# Patient Record
Sex: Female | Born: 2000 | Hispanic: Yes | Marital: Single | Smoking: Never smoker
Health system: Southern US, Community
[De-identification: ages and names within clinical notes are randomized; demographics above are authoritative.]

## PROBLEM LIST (undated history)

## (undated) DIAGNOSIS — F419 Anxiety disorder, unspecified: Secondary | ICD-10-CM

## (undated) DIAGNOSIS — N912 Amenorrhea, unspecified: Secondary | ICD-10-CM

## (undated) DIAGNOSIS — E2839 Other primary ovarian failure: Secondary | ICD-10-CM

## (undated) DIAGNOSIS — R51 Headache: Secondary | ICD-10-CM

## (undated) DIAGNOSIS — F32A Depression, unspecified: Secondary | ICD-10-CM

## (undated) DIAGNOSIS — K829 Disease of gallbladder, unspecified: Secondary | ICD-10-CM

## (undated) DIAGNOSIS — K3184 Gastroparesis: Secondary | ICD-10-CM

## (undated) DIAGNOSIS — E282 Polycystic ovarian syndrome: Secondary | ICD-10-CM

## (undated) DIAGNOSIS — K589 Irritable bowel syndrome without diarrhea: Secondary | ICD-10-CM

## (undated) DIAGNOSIS — E229 Hyperfunction of pituitary gland, unspecified: Secondary | ICD-10-CM

## (undated) DIAGNOSIS — F329 Major depressive disorder, single episode, unspecified: Secondary | ICD-10-CM

## (undated) DIAGNOSIS — F988 Other specified behavioral and emotional disorders with onset usually occurring in childhood and adolescence: Secondary | ICD-10-CM

## (undated) HISTORY — DX: Irritable bowel syndrome, unspecified: K58.9

## (undated) HISTORY — DX: Gastroparesis: K31.84

## (undated) HISTORY — DX: Hyperfunction of pituitary gland, unspecified: E22.9

## (undated) HISTORY — DX: Disease of gallbladder, unspecified: K82.9

## (undated) HISTORY — DX: Headache: R51

## (undated) HISTORY — DX: Amenorrhea, unspecified: N91.2

---
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
## 2021-01-01 ENCOUNTER — Other Ambulatory Visit: Payer: Self-pay | Admitting: Obstetrics and Gynecology

## 2021-01-03 ENCOUNTER — Other Ambulatory Visit: Payer: Self-pay

## 2021-01-03 ENCOUNTER — Ambulatory Visit
Admission: RE | Admit: 2021-01-03 | Discharge: 2021-01-03 | Disposition: A | Discharge status: Home / Self Care | Payer: Self-pay | Source: Ambulatory Visit | Attending: Neurology | Admitting: Neurology

## 2021-01-03 ENCOUNTER — Telehealth: Payer: Self-pay | Admitting: Neurology

## 2021-01-03 ENCOUNTER — Other Ambulatory Visit: Payer: Self-pay | Admitting: Obstetrics and Gynecology

## 2021-01-03 DIAGNOSIS — H539 Unspecified visual disturbance: Secondary | ICD-10-CM

## 2021-01-03 DIAGNOSIS — R51 Headache with orthostatic component, not elsewhere classified: Secondary | ICD-10-CM

## 2021-01-03 DIAGNOSIS — R519 Headache, unspecified: Secondary | ICD-10-CM

## 2021-01-03 MED ORDER — GADOBENATE DIMEGLUMINE 529 MG/ML IV SOLN
20.0000 mL | Freq: Once | INTRAVENOUS | Status: AC | PRN
  Administered 2021-01-03: 20 mL via INTRAVENOUS

## 2021-01-03 NOTE — Telephone Encounter (Signed)
Cathy with [facility] called and left me a voicemail on my phone wanting to know what kind of labs they were doing with the LP. Her phone number is [phone] & fax # [phone]

## 2021-01-03 NOTE — Telephone Encounter (Signed)
Spoke w/ Dr. Epimenio Foot. Would like the following labs w/ LP: cbc w/ diff, protein, glucose. I called back and LVM relaying this. Advised her to call back if she has any more questions.

## 2021-01-03 NOTE — Telephone Encounter (Signed)
Medication refill request: Yasmin  Last AEX:  01-13-20 BS Next AEX: not scheduled, note to pharmacy to have patient call to schedule Last MMG (if hormonal medication request): n/a Refill authorized: Today, please advise.   Medication pended for #28, 0RF. Please refill if appropriate.

## 2021-01-07 ENCOUNTER — Ambulatory Visit
Admission: RE | Admit: 2021-01-07 | Discharge: 2021-01-07 | Disposition: A | Discharge status: Home / Self Care | Payer: Managed Care, Other (non HMO) | Source: Ambulatory Visit | Attending: Neurology | Admitting: Neurology

## 2021-01-07 ENCOUNTER — Other Ambulatory Visit: Payer: Self-pay

## 2021-01-07 VITALS — BP 125/65 | HR 77

## 2021-01-07 DIAGNOSIS — R519 Headache, unspecified: Secondary | ICD-10-CM

## 2021-01-07 DIAGNOSIS — H539 Unspecified visual disturbance: Secondary | ICD-10-CM

## 2021-01-07 DIAGNOSIS — R51 Headache with orthostatic component, not elsewhere classified: Secondary | ICD-10-CM

## 2021-01-07 LAB — GLUCOSE, CSF: Glucose, CSF: 64 mg/dL (ref 40–80)

## 2021-01-07 LAB — PROTEIN, CSF: Total Protein, CSF: 22 mg/dL (ref 15–45)

## 2021-01-07 NOTE — Discharge Instructions (Signed)
Lumbar Puncture Discharge Instructions  1. Go home and rest quietly as needed. You may resume normal activities; however, do not exert yourself strongly or do any heavy lifting today and tomorrow.   2. DO NOT drive today.    3. You may resume your normal diet and medications unless otherwise indicated. Drink lots of extra fluids today and tomorrow.   4. The incidence of headache, nausea, or vomiting is about 5% (one in 20 patients).  If you develop a headache, lie flat for 24 hours and drink plenty of fluids until the headache goes away.  Caffeinated beverages may be helpful. If when you get up you still have a headache when standing, go back to bed and force fluids for another 24 hours.   5. If you develop severe nausea and vomiting or a headache that does not go away with the flat bedrest after 48 hours, please call [phone].   6. Call your physician for a follow-up appointment.  The results of your Lumbar Puncture will be sent directly to your physician and they will contact you.   7. If you have any questions or if complications develop after you arrive home, please call [phone].  Discharge instructions have been explained to the patient.  The patient, or the person responsible for the patient, fully understands these instructions.   Thank you for visiting our office today.  

## 2021-01-10 ENCOUNTER — Telehealth: Payer: Self-pay | Admitting: Neurology

## 2021-01-10 ENCOUNTER — Other Ambulatory Visit: Payer: Self-pay

## 2021-01-10 ENCOUNTER — Other Ambulatory Visit: Payer: Self-pay | Admitting: Neurology

## 2021-01-10 ENCOUNTER — Ambulatory Visit
Admission: RE | Admit: 2021-01-10 | Discharge: 2021-01-10 | Disposition: A | Discharge status: Home / Self Care | Payer: Managed Care, Other (non HMO) | Source: Ambulatory Visit | Attending: Neurology | Admitting: Neurology

## 2021-01-10 DIAGNOSIS — G971 Other reaction to spinal and lumbar puncture: Secondary | ICD-10-CM

## 2021-01-10 MED ORDER — IOPAMIDOL (ISOVUE-M 200) INJECTION 41%
1.0000 mL | Freq: Once | INTRAMUSCULAR | Status: AC
  Administered 2021-01-10: 1 mL via EPIDURAL

## 2021-01-10 NOTE — Telephone Encounter (Signed)
Looks like pt is scheduled for 12 pm today.

## 2021-01-10 NOTE — Telephone Encounter (Signed)
Spoke with Dr Lucia Gaskins. Will have patient get a blood patch. Order placed and I called GI and LVM for [name] in the spinal services department.

## 2021-01-10 NOTE — Telephone Encounter (Addendum)
Pt's father called stating that GI is waiting on the order for the pt's blood patch for the pt's headache due to the lumbar puncture. Pt's father would like to be called once the order has been put in.

## 2021-01-10 NOTE — Telephone Encounter (Signed)
Answering service sent a text that she was having a severe headache (father had called).   She had an LP 01/07/2021.  I called back twice at 10:20 and 10:28 and message went straight to voicemail both times.  I LM that I would let you know that they had called

## 2021-01-10 NOTE — Progress Notes (Signed)
Blood drawn from pts RAC for blood patch by J. Nehemiah Massed, Charity fundraiser. 1 successful attempt. Pt tolerated well. Gauze and tape applied after.

## 2021-01-10 NOTE — Telephone Encounter (Signed)
Spoke with patient's father [name]. Pt had LP on 5/6, last Friday.  He stated she was on bedrest for about 22 hours before she took a shower.  Otherwise she would only sit up to eat or use the restroom.  He believes Friday night she did not have a headache at all until and on Saturday felt decent.  She developed a head cold and had some sneezing and coughing.  She stated her back was a little sore. He visualized her back and stated there was no swelling or bruising.  then the headache started Saturday night.  Initially was better while lying down but has progressed to a constant, severe headache. She is still resting and her father has tried to push fluids and caffeine.  She had a Coke last night and this morning he is going to have her drink a Mtn Dew.  He states the patient does not have any other symptoms such as nausea and vomiting, just headache.

## 2021-01-10 NOTE — Telephone Encounter (Signed)
[facility] at [facility] called wanting to know when the Blood Patch will be fax over to [phone]. Jeannie's phone number [phone] in case you are needing to speak to her. Please advise.

## 2021-01-10 NOTE — Discharge Instructions (Signed)

## 2021-01-11 ENCOUNTER — Other Ambulatory Visit: Payer: Self-pay | Admitting: *Deleted

## 2021-01-11 MED ORDER — BUTALBITAL-APAP-CAFFEINE 50-325-40 MG PO TABS: 1.0000 | ORAL_TABLET | Freq: Four times a day (QID) | ORAL | 0 refills | Status: DC | PRN
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
## 2021-01-11 NOTE — Telephone Encounter (Signed)
Spoke with Dr. Lucia Gaskins who advised pt can try Fioricet 1-2 tablets q6h prn.  We can also plan to see the patient tomorrow morning at 7:30 AM if she is not feeling better.  Will likely need a letter for school.   Spoke with the patient's father, [name], who is agreeable to the above plan.  He will be in touch with the patient's school.  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  He understands the instructions for the Fioricet.  I scheduled patient for tomorrow morning office visit.  He will call back at the end of the day to let us know how she is feeling.

## 2021-01-11 NOTE — Telephone Encounter (Signed)
Ok, she had a blood patch yesterday afternoon.

## 2021-01-11 NOTE — Telephone Encounter (Addendum)
Pt's father Loraine Leriche called [place]. He stated her head is still throbbing. It may have decreased in pain a little bit but she still has the headache whether she is sitting up or lying down. She is tired as well and dealing with a sinus cold. Father is concerned because the patient has finals this week starting tomorrow and she goes to school 2 hours away. She is currently at home. She has tried Tylenol. He said all of her medications are at school and they had enough to get her through Sunday but did not realize she would still be at home. She has not been able to take the Propranolol or the Zolmitriptan.

## 2021-01-11 NOTE — Telephone Encounter (Signed)
Would you see how she is doing today?

## 2021-01-12 ENCOUNTER — Other Ambulatory Visit: Payer: Self-pay

## 2021-01-12 ENCOUNTER — Other Ambulatory Visit: Payer: Self-pay | Admitting: Neurology

## 2021-01-12 ENCOUNTER — Ambulatory Visit: Payer: Managed Care, Other (non HMO) | Admitting: Neurology

## 2021-01-12 ENCOUNTER — Encounter: Payer: Self-pay | Admitting: Neurology

## 2021-01-12 VITALS — BP 116/79 | HR 99 | Ht 63.0 in | Wt 227.0 lb

## 2021-01-12 DIAGNOSIS — R519 Headache, unspecified: Secondary | ICD-10-CM

## 2021-01-12 DIAGNOSIS — G43009 Migraine without aura, not intractable, without status migrainosus: Secondary | ICD-10-CM

## 2021-01-12 MED ORDER — PROPRANOLOL HCL ER 80 MG PO CP24: 80.0000 mg | ORAL_CAPSULE | Freq: Every day | ORAL | 3 refills | Status: DC

## 2021-01-13 ENCOUNTER — Encounter: Payer: Self-pay | Admitting: Neurology

## 2021-01-13 NOTE — Addendum Note (Signed)
Addended by: Naomie Dean B on: 01/13/2021 09:22 AM   Modules accepted: Level of Service

## 2021-01-13 NOTE — Addendum Note (Signed)
Addended by: Naomie Dean B on: 01/13/2021 08:51 AM   Modules accepted: Level of Service

## 2021-02-07 ENCOUNTER — Other Ambulatory Visit: Payer: Self-pay | Admitting: Obstetrics and Gynecology

## 2021-02-08 NOTE — Progress Notes (Deleted)
20 y.o. G0P0000 Single Hispanic female here for annual exam.    PCP:  Dorinda Hill, MD   No LMP recorded. (Menstrual status: Oral contraceptives).           Sexually active: No.  The current method of family planning is OCP (estrogen/progesterone) Yasmin. Exercising: No.  Smoker: No  Health Maintenance: Pap: n/a History of abnormal Pap: n/a TDaP:  *** Gardasil: No HIV: No Hep C: No Screening Labs: PCP   reports that she has never smoked. She has never used smokeless tobacco. She reports that she does not drink alcohol and does not use drugs.  Past Medical History:  Diagnosis Date  . Acquired primary ovarian hypogonadism   . ADD (attention deficit disorder)   . Amenorrhea    when cycles first started age 7  . Anxiety   . Depression   . Elevated prolactin level 09/16/2018   Taking Pamelor.  . Headache(784.0)   . IBS (irritable bowel syndrome)   . PCOS (polycystic ovarian syndrome)   . PCOS (polycystic ovarian syndrome)     Past Surgical History:  Procedure Laterality Date  . CHOLECYSTECTOMY    . CHOLECYSTECTOMY  2018    Current Outpatient Medications  Medication Sig Dispense Refill  . Acetaminophen (TYLENOL PO) Take 1 tablet by mouth daily as needed (pain). Fast acting    . butalbital-acetaminophen-caffeine (FIORICET) 50-325-40 MG tablet Take 1-2 tablets by mouth every 6 (six) hours as needed for headache. 15 tablet 0  . dicyclomine (BENTYL) 20 MG tablet Take 20 mg by mouth as needed (pain).     . drospirenone-ethinyl estradiol (YASMIN) 3-0.03 MG tablet TAKE 1 TABLET BY MOUTH DAILY 28 tablet 0  . gabapentin (NEURONTIN) 300 MG capsule Take 300 mg by mouth 2 (two) times daily.    . hyoscyamine (LEVSIN SL) 0.125 MG SL tablet Place under the tongue every 4 (four) hours as needed.    Marland Kitchen ibuprofen (ADVIL) 200 MG tablet Take 200 mg by mouth every 6 (six) hours as needed.    . melatonin 5 MG TABS Take 5 mg by mouth. Natrol melatonin    . metroNIDAZOLE (METROGEL) 0.75 % gel  Apply topically daily as needed.    . nortriptyline (PAMELOR) 25 MG capsule nortriptyline 25 mg capsule  TAKE 1 CAPSULE BY MOUTH AT BEDTIME FOR HEADACHE (Patient not taking: Reported on 01/12/2021)    . omeprazole (PRILOSEC) 20 MG capsule omeprazole 20 mg capsule,delayed release  Take 1 capsule every day by oral route in the morning. (Patient not taking: Reported on 01/12/2021)    . ondansetron (ZOFRAN-ODT) 4 MG disintegrating tablet Take 1-2 tablets (4-8 mg total) by mouth every 8 (eight) hours as needed. 30 tablet 3  . pantoprazole (PROTONIX) 40 MG tablet pantoprazole 40 mg tablet,delayed release  TAKE 1 TABLET BY MOUTH EVERY MORNING BEFORE BREAKFAST FOR 10 DAYS    . propranolol ER (INDERAL LA) 80 MG 24 hr capsule Take 1 capsule (80 mg total) by mouth at bedtime. 90 capsule 3  . sertraline (ZOLOFT) 100 MG tablet Take 100 mg by mouth daily.    . sodium fluoride (FLUORISHIELD) 1.1 % GEL dental gel PreviDent 5000 Dry Mouth 1.1 % dental paste    . UNABLE TO FIND Med Name: IMD Complete Probiotic Platinum    . Vitamin D, Ergocalciferol, (DRISDOL) 1.25 MG (50000 UNIT) CAPS capsule Take 1 capsule by mouth every 14 (fourteen) days.    Marland Kitchen ZOLMitriptan (ZOMIG) 2.5 MG tablet Take 1 tablet by mouth at onset  of migraine. May repeat in 2 hours if headache persists or recurs. Max of 2 tablets (5 mg) in 24 hours. 10 tablet 2   No current facility-administered medications for this visit.    Family History  Adopted: Yes    Review of Systems  Exam:   There were no vitals taken for this visit.    General appearance: alert, cooperative and appears stated age Head: normocephalic, without obvious abnormality, atraumatic Neck: no adenopathy, supple, symmetrical, trachea midline and thyroid normal to inspection and palpation Lungs: clear to auscultation bilaterally Breasts: normal appearance, no masses or tenderness, No nipple retraction or dimpling, No nipple discharge or bleeding, No axillary adenopathy Heart:  regular rate and rhythm Abdomen: soft, non-tender; no masses, no organomegaly Extremities: extremities normal, atraumatic, no cyanosis or edema Skin: skin color, texture, turgor normal. No rashes or lesions Lymph nodes: cervical, supraclavicular, and axillary nodes normal. Neurologic: grossly normal  Pelvic: External genitalia:  no lesions              No abnormal inguinal nodes palpated.              Urethra:  normal appearing urethra with no masses, tenderness or lesions              Bartholins and Skenes: normal                 Vagina: normal appearing vagina with normal color and discharge, no lesions              Cervix: no lesions              Pap taken: {yes no:314532} Bimanual Exam:  Uterus:  normal size, contour, position, consistency, mobility, non-tender              Adnexa: no mass, fullness, tenderness              Rectal exam: {yes no:314532}.  Confirms.              Anus:  normal sphincter tone, no lesions  Chaperone was present for exam.  Assessment:   Well woman visit with normal exam.   Plan: Mammogram screening discussed. Self breast awareness reviewed. Pap and HR HPV as above. Guidelines for Calcium, Vitamin D, regular exercise program including cardiovascular and weight bearing exercise.   Follow up annually and prn.   Additional counseling given.  {yes [id number]. _______ minutes face to face time of which over 50% was spent in counseling.    After visit summary provided.

## 2021-02-11 NOTE — Progress Notes (Signed)
20 y.o. G0P0000 Single Hispanic female here for annual exam.    Menses are monthly.  Medium flow.  No extensive cramping.   Abdominal pain is improved.   Taking birth control pills and has monthly cycles. Takes 2 pills late or skips per month.  Wants to continue taking her pills.   Not sexually active and does not need pregnancy prevention.   Going to school at [employer].   PCP:    Chales Salmon, MD    Patient's last menstrual period was 01/06/2020 (exact date).     Period Cycle (Days): 30 Period Duration (Days): 6-7 Period Pattern: Regular Menstrual Flow: Moderate Menstrual Control: Maxi pad, Panty liner Dysmenorrhea: None     Sexually active: No.  The current method of family planning is OCP (estrogen/progesterone).    Exercising: No.  The patient does not participate in regular exercise at present. Smoker:  no  Health Maintenance: Pap:  n/a History of abnormal Pap:  n/a Colonoscopy:  n/a TDaP:  unsure Gardasil:   no HIV: no Hep C: no Screening Labs:  PCP   reports that she has never smoked. She has never used smokeless tobacco. She reports that she does not drink alcohol and does not use drugs.  Past Medical History:  Diagnosis Date   Acquired primary ovarian hypogonadism    ADD (attention deficit disorder)    Amenorrhea    when cycles first started age 47   Anxiety    Depression    Elevated prolactin level 09/16/2018   Taking Pamelor.   Headache(784.0)    IBS (irritable bowel syndrome)    PCOS (polycystic ovarian syndrome)    PCOS (polycystic ovarian syndrome)     Past Surgical History:  Procedure Laterality Date   CHOLECYSTECTOMY     CHOLECYSTECTOMY  2018    Current Outpatient Medications  Medication Sig Dispense Refill   Acetaminophen (TYLENOL PO) Take 1 tablet by mouth daily as needed (pain). Fast acting     butalbital-acetaminophen-caffeine (FIORICET) 50-325-40 MG tablet Take 1-2 tablets by mouth every 6 (six) hours as needed for headache. 15  tablet 0   dicyclomine (BENTYL) 20 MG tablet Take 20 mg by mouth as needed (pain).      gabapentin (NEURONTIN) 300 MG capsule Take 300 mg by mouth 2 (two) times daily.     hyoscyamine (LEVSIN SL) 0.125 MG SL tablet Place under the tongue every 4 (four) hours as needed.     ibuprofen (ADVIL) 200 MG tablet Take 200 mg by mouth every 6 (six) hours as needed.     melatonin 5 MG TABS Take 5 mg by mouth. Natrol melatonin     ondansetron (ZOFRAN-ODT) 4 MG disintegrating tablet Take 1-2 tablets (4-8 mg total) by mouth every 8 (eight) hours as needed. 30 tablet 3   pantoprazole (PROTONIX) 40 MG tablet pantoprazole 40 mg tablet,delayed release  TAKE 1 TABLET BY MOUTH EVERY MORNING BEFORE BREAKFAST FOR 10 DAYS     propranolol ER (INDERAL LA) 80 MG 24 hr capsule Take 1 capsule (80 mg total) by mouth at bedtime. 90 capsule 3   sertraline (ZOLOFT) 100 MG tablet Take 100 mg by mouth daily.     sodium fluoride (FLUORISHIELD) 1.1 % GEL dental gel PreviDent 5000 Dry Mouth 1.1 % dental paste     UNABLE TO FIND Med Name: IMD Complete Probiotic Platinum     Vitamin D, Ergocalciferol, (DRISDOL) 1.25 MG (50000 UNIT) CAPS capsule Take 1 capsule by mouth every 14 (fourteen) days.  ZOLMitriptan (ZOMIG) 2.5 MG tablet Take 1 tablet by mouth at onset of migraine. May repeat in 2 hours if headache persists or recurs. Max of 2 tablets (5 mg) in 24 hours. 10 tablet 2   drospirenone-ethinyl estradiol (YASMIN) 3-0.03 MG tablet Take 1 tablet by mouth daily. 84 tablet 3   nortriptyline (PAMELOR) 25 MG capsule nortriptyline 25 mg capsule  TAKE 1 CAPSULE BY MOUTH AT BEDTIME FOR HEADACHE (Patient not taking: Reported on 01/12/2021)     omeprazole (PRILOSEC) 20 MG capsule omeprazole 20 mg capsule,delayed release  Take 1 capsule every day by oral route in the morning. (Patient not taking: Reported on 01/12/2021)     No current facility-administered medications for this visit.    Family History  Adopted: Yes    Review of Systems   Constitutional: Negative.   HENT: Negative.    Eyes: Negative.   Respiratory: Negative.    Cardiovascular: Negative.   Gastrointestinal: Negative.   Endocrine: Negative.   Genitourinary: Negative.   Musculoskeletal: Negative.   Skin: Negative.   Allergic/Immunologic: Negative.   Neurological: Negative.   Hematological: Negative.   Psychiatric/Behavioral: Negative.     Exam:   BP 130/88   Pulse 94   Ht 5\' 2"  (1.575 m)   Wt 230 lb (104.3 kg)   LMP 02/07/2021 (Exact Date)   SpO2 98%   BMI 42.07 kg/m     General appearance: alert, cooperative and appears stated age Head: normocephalic, without obvious abnormality, atraumatic Neck: no adenopathy, supple, symmetrical, trachea midline and thyroid normal to inspection and palpation Lungs: clear to auscultation bilaterally Heart: regular rate and rhythm Abdomen: soft, non-tender; no masses, no organomegaly Extremities: extremities normal, atraumatic, no cyanosis or edema Skin: skin color, texture, turgor normal. No rashes or lesions Lymph nodes: cervical, supraclavicular, and axillary nodes normal. Neurologic: grossly normal  Pelvic: deferred.   Chaperone was present for exam:  [place].  Assessment:   Well woman visit with normal exam. Dysmenorrhea.   Controlled on COCs. Birth control pill surveillance. Hx PCOS. Chronic abdominal pain.   Improved.  Hx elevated prolactin.  On Pamelor.   Plan: Mammogram screening age 83 Pap age 58.  Guidelines for Calcium, Vitamin D, regular exercise program including cardiovascular and weight bearing exercise. Start Gardasil vaccine series.  Refill of Yasmin.  Check prolactin level.  Follow up annually and prn.

## 2021-02-15 ENCOUNTER — Ambulatory Visit: Payer: Managed Care, Other (non HMO) | Admitting: Obstetrics and Gynecology

## 2021-02-15 ENCOUNTER — Encounter: Payer: Self-pay | Admitting: Obstetrics and Gynecology

## 2021-02-15 ENCOUNTER — Ambulatory Visit (INDEPENDENT_AMBULATORY_CARE_PROVIDER_SITE_OTHER): Payer: Managed Care, Other (non HMO) | Admitting: Obstetrics and Gynecology

## 2021-02-15 ENCOUNTER — Other Ambulatory Visit: Payer: Self-pay

## 2021-02-15 VITALS — BP 130/88 | HR 94 | Ht 62.0 in | Wt 230.0 lb

## 2021-02-15 DIAGNOSIS — Z3041 Encounter for surveillance of contraceptive pills: Secondary | ICD-10-CM

## 2021-02-15 DIAGNOSIS — Z8639 Personal history of other endocrine, nutritional and metabolic disease: Secondary | ICD-10-CM | POA: Diagnosis not present

## 2021-02-15 DIAGNOSIS — Z23 Encounter for immunization: Secondary | ICD-10-CM

## 2021-02-15 DIAGNOSIS — Z0000 Encounter for general adult medical examination without abnormal findings: Secondary | ICD-10-CM | POA: Diagnosis not present

## 2021-02-15 MED ORDER — DROSPIRENONE-ETHINYL ESTRADIOL 3-0.03 MG PO TABS: 1.0000 | ORAL_TABLET | Freq: Every day | ORAL | 3 refills | Status: DC

## 2021-02-15 NOTE — Patient Instructions (Signed)

## 2021-02-16 LAB — PROLACTIN: Prolactin: 12.3 ng/mL

## 2021-02-24 ENCOUNTER — Telehealth: Payer: Self-pay

## 2021-02-24 NOTE — Telephone Encounter (Signed)
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  I advised him it is actually due in 2 months and patient will be home from school at that time so issue resolved.

## 2021-04-13 ENCOUNTER — Ambulatory Visit: Payer: Managed Care, Other (non HMO) | Admitting: Neurology

## 2021-04-26 ENCOUNTER — Telehealth: Payer: Self-pay

## 2021-04-26 NOTE — Telephone Encounter (Signed)
Dad called regarding when HPV vaccine due and if she should come before returning to school. She received first vaccine 02/15/21/ I advised him next vaccine is 1-2  mos after first one.  He will call to schedule appointment this week after speaking with daughter.

## 2021-04-27 ENCOUNTER — Ambulatory Visit: Payer: Managed Care, Other (non HMO)

## 2021-11-26 ENCOUNTER — Other Ambulatory Visit: Payer: Self-pay | Admitting: Neurology

## 2022-01-30 ENCOUNTER — Emergency Department (HOSPITAL_BASED_OUTPATIENT_CLINIC_OR_DEPARTMENT_OTHER): Payer: Managed Care, Other (non HMO)

## 2022-01-30 ENCOUNTER — Other Ambulatory Visit: Payer: Self-pay

## 2022-01-30 ENCOUNTER — Emergency Department (HOSPITAL_BASED_OUTPATIENT_CLINIC_OR_DEPARTMENT_OTHER)
Admission: EM | Admit: 2022-01-30 | Discharge: 2022-01-31 | Disposition: A | Discharge status: Home / Self Care | Payer: Managed Care, Other (non HMO) | Attending: Emergency Medicine | Admitting: Emergency Medicine

## 2022-01-30 ENCOUNTER — Encounter (HOSPITAL_BASED_OUTPATIENT_CLINIC_OR_DEPARTMENT_OTHER): Payer: Self-pay | Admitting: Emergency Medicine

## 2022-01-30 DIAGNOSIS — W540XXA Bitten by dog, initial encounter: Secondary | ICD-10-CM | POA: Diagnosis not present

## 2022-01-30 DIAGNOSIS — S51012A Laceration without foreign body of left elbow, initial encounter: Secondary | ICD-10-CM | POA: Diagnosis not present

## 2022-01-30 DIAGNOSIS — Z91040 Latex allergy status: Secondary | ICD-10-CM | POA: Insufficient documentation

## 2022-01-30 DIAGNOSIS — S81012A Laceration without foreign body, left knee, initial encounter: Secondary | ICD-10-CM | POA: Diagnosis not present

## 2022-01-30 DIAGNOSIS — S51812A Laceration without foreign body of left forearm, initial encounter: Secondary | ICD-10-CM | POA: Insufficient documentation

## 2022-01-30 DIAGNOSIS — S61532A Puncture wound without foreign body of left wrist, initial encounter: Secondary | ICD-10-CM | POA: Diagnosis not present

## 2022-01-30 DIAGNOSIS — Z23 Encounter for immunization: Secondary | ICD-10-CM | POA: Diagnosis not present

## 2022-01-30 DIAGNOSIS — S59902A Unspecified injury of left elbow, initial encounter: Secondary | ICD-10-CM | POA: Diagnosis present

## 2022-01-30 DIAGNOSIS — S61236A Puncture wound without foreign body of right little finger without damage to nail, initial encounter: Secondary | ICD-10-CM | POA: Insufficient documentation

## 2022-01-30 DIAGNOSIS — S61032A Puncture wound without foreign body of left thumb without damage to nail, initial encounter: Secondary | ICD-10-CM | POA: Diagnosis not present

## 2022-01-30 MED ORDER — HYDROCODONE-ACETAMINOPHEN 5-325 MG PO TABS
1.0000 | ORAL_TABLET | Freq: Once | ORAL | Status: DC | PRN
  Filled 2022-01-30: qty 1

## 2022-01-30 MED ORDER — LIDOCAINE-EPINEPHRINE-TETRACAINE (LET) TOPICAL GEL: 9.0000 mL | Freq: Once | TOPICAL | Status: DC

## 2022-01-30 MED ORDER — LIDOCAINE-EPINEPHRINE-TETRACAINE (LET) TOPICAL GEL
3.0000 mL | Freq: Once | TOPICAL | Status: AC
  Administered 2022-01-30: 3 mL via TOPICAL
  Filled 2022-01-30: qty 3

## 2022-01-30 MED ORDER — TETANUS-DIPHTH-ACELL PERTUSSIS 5-2.5-18.5 LF-MCG/0.5 IM SUSY
0.5000 mL | PREFILLED_SYRINGE | Freq: Once | INTRAMUSCULAR | Status: AC
  Administered 2022-01-30: 0.5 mL via INTRAMUSCULAR
  Filled 2022-01-30: qty 0.5

## 2022-01-30 MED ORDER — SODIUM CHLORIDE 0.9 % IV SOLN
3.0000 g | Freq: Once | INTRAVENOUS | Status: AC
  Administered 2022-01-31: 3 g via INTRAVENOUS

## 2022-01-30 NOTE — Progress Notes (Signed)
Patient with dog bite to the left distal thigh, with clinical concern for possible joint violation.  We have done an x-ray, as well as a CT scan, neither of which have confirmed any air within the joint.  I have discussed with the ER providers, who have indicated that there may be some adipose, or serous, or possible joint fluid extravasation from the wounds.  I have also critically evaluated the CT scan, and on multiple planes I can see hemorrhage within the subcutaneous adipose envelope, which is fairly substantial, BMI of 40, and yet I do not see evidence for joint violation, particularly at the location of the bite.  I also do not see intra-articular effusion or blood.  Plan for antibiotics while in the emergency room, oral antibiotics upon discharge, and evaluation tomorrow in our office.  It is difficult if not impossible to determine, although the radiographic findings so far do not appear that there has been substantial joint contamination.  We will monitor clinically and consider surgical washout if infection concern arises.  Teryl Lucy, MD

## 2022-01-30 NOTE — Progress Notes (Incomplete)
Patient with dog bite to the left distal thigh, with clinical concern for possible joint violation.  We have done an x-ray, as well as a CT scan, neither of which have confirmed any air within the joint.  I have discussed with the ER providers, who have indicated that there may be some adipose, or serous, or possible joint fluid extravasation from the wounds.  Plan for antibiotics while in the emergency room, oral antibiotics upon discharge, and evaluation tomorrow in our office.  It is difficult if not impossible to determine, although the radiographic findings so far do not appear that there has been substantial joint contamination.  We will monitor clinically and consider surgical washout if infection concern arises.  Teryl Lucy, MD

## 2022-01-30 NOTE — ED Triage Notes (Signed)
Patient was at her dad's house and was trying to separate two dogs fighting at his house. Patient has bite to left lower arm. Bleeding controlled. Multiple scratches to bilateral arms and legs.

## 2022-01-30 NOTE — ED Notes (Signed)
Pt trying to separate 2 dogs that were fighting and receives bites and scratches, areas of concern 2 to left knee that continue to ooze blood and left elbow.  Minor scratches noted to thighs and fingers

## 2022-01-30 NOTE — ED Provider Notes (Signed)
[facility] EMERGENCY DEPARTMENT Provider Note   CSN: [id number] Arrival date & time: 01/30/22  1839     History {Add pertinent medical, surgical, social history, OB history to HPI:1} Chief Complaint  Patient presents with   Animal Bite    [name] is a 21 y.o. female unknown last tetanus shot who presents today for evaluation after she was bit by her own dogs.  She reports that the dogs were fighting and she attempted to separate them.  She has bites to her right small finger, multiple bites on her left hand, forearm, and elbow.  He has not taken anything for pain so far.  She reports scratches on her abdomen however her primary area of pain is her left arm.  The dogs are up-to-date on vaccines according to patient and family who is at bedside.    HPI     Home Medications Prior to Admission medications   Medication Sig Start Date End Date Taking? Authorizing Provider  Acetaminophen (TYLENOL PO) Take 1 tablet by mouth daily as needed (pain). Fast acting    [provider]  butalbital-acetaminophen-caffeine (FIORICET) 50-325-40 MG tablet Take 1-2 tablets by mouth every 6 (six) hours as needed for headache. 01/11/21   Anson Fret, MD  dicyclomine (BENTYL) 20 MG tablet Take 20 mg by mouth as needed (pain).     [provider]  drospirenone-ethinyl estradiol (YASMIN) 3-0.03 MG tablet Take 1 tablet by mouth daily. 02/15/21   Patton Salles, MD  gabapentin (NEURONTIN) 300 MG capsule Take 300 mg by mouth 2 (two) times daily.    [provider]  hyoscyamine (LEVSIN SL) 0.125 MG SL tablet Place under the tongue every 4 (four) hours as needed. 10/21/20   [provider]  ibuprofen (ADVIL) 200 MG tablet Take 200 mg by mouth every 6 (six) hours as needed.    [provider]  melatonin 5 MG TABS Take 5 mg by mouth. Natrol melatonin    [provider]  nortriptyline (PAMELOR) 25 MG capsule nortriptyline 25 mg  capsule  TAKE 1 CAPSULE BY MOUTH AT BEDTIME FOR HEADACHE Patient not taking: Reported on 01/12/2021    [provider]  omeprazole (PRILOSEC) 20 MG capsule omeprazole 20 mg capsule,delayed release  Take 1 capsule every day by oral route in the morning. Patient not taking: Reported on 01/12/2021    [provider]  ondansetron (ZOFRAN-ODT) 4 MG disintegrating tablet Take 1-2 tablets (4-8 mg total) by mouth every 8 (eight) hours as needed. 12/14/20   Anson Fret, MD  pantoprazole (PROTONIX) 40 MG tablet pantoprazole 40 mg tablet,delayed release  TAKE 1 TABLET BY MOUTH EVERY MORNING BEFORE BREAKFAST FOR 10 DAYS 10/24/20   [provider]  propranolol ER (INDERAL LA) 80 MG 24 hr capsule Take 1 capsule (80 mg total) by mouth at bedtime. 01/12/21   Anson Fret, MD  sertraline (ZOLOFT) 100 MG tablet Take 100 mg by mouth daily. 11/18/19   [provider]  sodium fluoride (FLUORISHIELD) 1.1 % GEL dental gel PreviDent 5000 Dry Mouth 1.1 % dental paste    [provider]  UNABLE TO FIND Med Name: IMD Complete Probiotic Platinum    [provider]  Vitamin D, Ergocalciferol, (DRISDOL) 1.25 MG (50000 UNIT) CAPS capsule Take 1 capsule by mouth every 14 (fourteen) days. 11/13/20   [provider]  ZOLMitriptan (ZOMIG) 2.5 MG tablet Take 1 tablet by mouth at onset of migraine. May repeat  in 2 hours if headache persists or recurs. Max of 2 tablets (5 mg) in 24 hours. 12/23/20   Anson Fret, MD      Allergies    Latex    Review of Systems   Review of Systems See above Physical Exam Updated Vital Signs BP 122/65 (BP Location: Right Arm)   Pulse (!) 111   Temp 97.6 F (36.4 C)   Resp 18   Ht 5\' 3"  (1.6 m)   Wt 101.2 kg   LMP 01/23/2022 (Approximate)   SpO2 98%   BMI 39.50 kg/m  Physical Exam Vitals and nursing note reviewed.  Constitutional:      General: She is not in acute distress.    Appearance: She is not diaphoretic.   HENT:     Head: Normocephalic and atraumatic.  Eyes:     General: No scleral icterus.       Right eye: No discharge.        Left eye: No discharge.     Conjunctiva/sclera: Conjunctivae normal.  Cardiovascular:     Rate and Rhythm: Normal rate and regular rhythm.  Pulmonary:     Effort: Pulmonary effort is normal. No respiratory distress.     Breath sounds: No stridor.  Abdominal:     General: There is no distension.  Musculoskeletal:        General: No deformity.     Cervical back: Normal range of motion.  Skin:    General: Skin is warm and dry.  Neurological:     Mental Status: She is alert.     Motor: No abnormal muscle tone.  Psychiatric:        Behavior: Behavior normal.   *** ED Results / Procedures / Treatments   Labs (all labs ordered are listed, but only abnormal results are displayed) Labs Reviewed - No data to display  EKG None  Radiology No results found.  Procedures Procedures  {Document cardiac monitor, telemetry assessment procedure when appropriate:1}  Medications Ordered in ED Medications  Tdap (BOOSTRIX) injection 0.5 mL (has no administration in time range)  HYDROcodone-acetaminophen (NORCO/VICODIN) 5-325 MG per tablet 1 tablet (has no administration in time range)    ED Course/ Medical Decision Making/ A&P                           Medical Decision Making Amount and/or Complexity of Data Reviewed Radiology: ordered.  Risk Prescription drug management.   ***  {Document critical care time when appropriate:1} {Document review of labs and clinical decision tools ie heart score, Chads2Vasc2 etc:1}  {Document your independent review of radiology images, and any outside records:1} {Document your discussion with family members, caretakers, and with consultants:1} {Document social determinants of health affecting pt's care:1} {Document your decision making why or why not admission, treatments were needed:1} Final Clinical Impression(s) /  ED Diagnoses Final diagnoses:  None    Rx / DC Orders ED Discharge Orders     None

## 2022-01-31 MED ORDER — ACETAMINOPHEN 325 MG PO TABS
650.0000 mg | ORAL_TABLET | Freq: Once | ORAL | Status: AC
  Administered 2022-01-31: 650 mg via ORAL
  Filled 2022-01-31: qty 2

## 2022-01-31 MED ORDER — IBUPROFEN 400 MG PO TABS
600.0000 mg | ORAL_TABLET | Freq: Once | ORAL | Status: AC
  Administered 2022-01-31: 600 mg via ORAL
  Filled 2022-01-31: qty 1

## 2022-01-31 MED ORDER — HYDROCODONE-ACETAMINOPHEN 5-325 MG PO TABS: 1.0000 | ORAL_TABLET | Freq: Four times a day (QID) | ORAL | 0 refills | Status: DC | PRN

## 2022-01-31 MED ORDER — AMOXICILLIN-POT CLAVULANATE 875-125 MG PO TABS: 1.0000 | ORAL_TABLET | Freq: Two times a day (BID) | ORAL | 0 refills | Status: AC

## 2022-01-31 NOTE — ED Notes (Signed)
Pt feels comfortable walking on the leg,does not want crutches.

## 2022-01-31 NOTE — Discharge Instructions (Addendum)
If you develop fevers, chills, you have concerns for infection or any new or concerning symptoms please seek additional medical care and evaluation. I would recommend going to [facility] in [place].  You may have diarrhea from the antibiotics.  It is very important that you continue to take the antibiotics even if you get diarrhea unless a medical professional tells you that you may stop taking them.  If you stop too early the bacteria you are being treated for will become stronger and you may need different, more powerful antibiotics that have more side effects and worsening diarrhea.  Please stay well hydrated and consider probiotics as they may decrease the severity of your diarrhea.  Please be aware that if you take any hormonal contraception (birth control pills, nexplanon, the ring, etc) that your birth control will not work while you are taking antibiotics and you need to use back up protection as directed on the birth control medication information insert.   Please take Ibuprofen (Advil, motrin) and Tylenol (acetaminophen) to relieve your pain.    You may take up to 600 MG (3 pills) of normal strength ibuprofen every 8 hours as needed.   You make take tylenol, up to 1,000 mg (two extra strength pills) every 8 hours as needed.   It is safe to take ibuprofen and tylenol at the same time as they work differently.   Do not take more than 3,000 mg tylenol in a 24 hour period (not more than one dose every 8 hours.  Please check all medication labels as many medications such as pain and cold medications may contain tylenol.  Do not drink alcohol while taking these medications.  Do not take other NSAID'S while taking ibuprofen (such as aleve or naproxen).  Please take ibuprofen with food to decrease stomach upset.  Today you received medications that may make you sleepy or impair your ability to make decisions.  For the next 24 hours please do not drive, operate heavy machinery, care for a  small child with out another adult present, or perform any activities that may cause harm to you or someone else if you were to fall asleep or be impaired.   You are being prescribed a medication which may make you sleepy. Please follow up of listed precautions for at least 24 hours after taking one dose.

## 2022-02-02 ENCOUNTER — Encounter (INDEPENDENT_AMBULATORY_CARE_PROVIDER_SITE_OTHER): Payer: Self-pay

## 2022-02-02 DIAGNOSIS — Z0289 Encounter for other administrative examinations: Secondary | ICD-10-CM

## 2022-03-03 ENCOUNTER — Other Ambulatory Visit: Payer: Self-pay | Admitting: Neurology

## 2022-03-09 LAB — CBC AND DIFFERENTIAL
HCT: 39 (ref 36–46)
Hemoglobin: 12.3 (ref 12.0–16.0)
Platelets: 439 10*3/uL — AB (ref 150–400)
WBC: 13.7

## 2022-03-09 LAB — BASIC METABOLIC PANEL
BUN: 8 (ref 4–21)
CO2: 22 (ref 13–22)
Chloride: 103 (ref 99–108)
Creatinine: 0.7 (ref 0.5–1.1)
Glucose: 89
Potassium: 4.5 mEq/L (ref 3.5–5.1)
Sodium: 140 (ref 137–147)

## 2022-03-09 LAB — HEPATIC FUNCTION PANEL
ALT: 19 U/L (ref 0–44)
AST: 20 (ref 2–40)
Alkaline Phosphatase: 78 (ref 25–125)

## 2022-03-09 LAB — COMPREHENSIVE METABOLIC PANEL
Albumin: 3.9 (ref 3.5–5.0)
Calcium: 9.7 (ref 8.7–10.7)
Globulin: 3.1

## 2022-03-09 LAB — LIPID PANEL
Cholesterol: 222 — AB (ref 0–200)
HDL: 78 — AB (ref 35–70)
LDL Cholesterol: 119
Triglycerides: 148 (ref 40–160)

## 2022-03-09 LAB — TSH: TSH: 2.69 (ref 0.41–5.90)

## 2022-03-09 LAB — CBC: RBC: 4.74 (ref 3.87–5.11)

## 2022-03-22 ENCOUNTER — Encounter (INDEPENDENT_AMBULATORY_CARE_PROVIDER_SITE_OTHER): Payer: Self-pay | Admitting: Family Medicine

## 2022-03-22 ENCOUNTER — Ambulatory Visit (INDEPENDENT_AMBULATORY_CARE_PROVIDER_SITE_OTHER): Payer: Managed Care, Other (non HMO) | Admitting: Family Medicine

## 2022-03-22 VITALS — BP 114/82 | HR 98 | Temp 98.0°F | Ht 62.0 in | Wt 216.0 lb

## 2022-03-22 DIAGNOSIS — R5383 Other fatigue: Secondary | ICD-10-CM

## 2022-03-22 DIAGNOSIS — Z6839 Body mass index (BMI) 39.0-39.9, adult: Secondary | ICD-10-CM

## 2022-03-22 DIAGNOSIS — K760 Fatty (change of) liver, not elsewhere classified: Secondary | ICD-10-CM

## 2022-03-22 DIAGNOSIS — F419 Anxiety disorder, unspecified: Secondary | ICD-10-CM | POA: Diagnosis not present

## 2022-03-22 DIAGNOSIS — E559 Vitamin D deficiency, unspecified: Secondary | ICD-10-CM

## 2022-03-22 DIAGNOSIS — E282 Polycystic ovarian syndrome: Secondary | ICD-10-CM | POA: Diagnosis not present

## 2022-03-22 DIAGNOSIS — F32A Depression, unspecified: Secondary | ICD-10-CM

## 2022-03-22 DIAGNOSIS — R0602 Shortness of breath: Secondary | ICD-10-CM

## 2022-03-23 ENCOUNTER — Encounter (INDEPENDENT_AMBULATORY_CARE_PROVIDER_SITE_OTHER): Payer: Self-pay | Admitting: Family Medicine

## 2022-03-23 LAB — HEMOGLOBIN A1C
Est. average glucose Bld gHb Est-mCnc: 111 mg/dL
Hgb A1c MFr Bld: 5.5 % (ref 4.8–5.6)

## 2022-03-23 LAB — T4, FREE: Free T4: 1.05 ng/dL (ref 0.82–1.77)

## 2022-03-23 LAB — VITAMIN B12: Vitamin B-12: 527 pg/mL (ref 232–1245)

## 2022-03-23 LAB — FOLATE: Folate: 11.1 ng/mL (ref >3.0)

## 2022-03-23 LAB — VITAMIN D 25 HYDROXY (VIT D DEFICIENCY, FRACTURES): Vit D, 25-Hydroxy: 27.3 ng/mL — ABNORMAL LOW (ref 30.0–100.0)

## 2022-03-23 LAB — INSULIN, RANDOM: INSULIN: 37.1 u[IU]/mL — ABNORMAL HIGH (ref 2.6–24.9)

## 2022-03-23 LAB — T3: T3, Total: 240 ng/dL — ABNORMAL HIGH (ref 71–180)

## 2022-03-27 ENCOUNTER — Other Ambulatory Visit (INDEPENDENT_AMBULATORY_CARE_PROVIDER_SITE_OTHER): Payer: Self-pay

## 2022-03-27 NOTE — Telephone Encounter (Signed)
Please advise 

## 2022-03-28 NOTE — Progress Notes (Signed)
Chief Complaint:   OBESITY [name] (MR# [id number]) is a 21 y.o. female who presents for evaluation and treatment of obesity and related comorbidities. Current BMI is Body mass index is 39.51 kg/m. [name] has been struggling with her weight for many years and has been unsuccessful in either losing weight, maintaining weight loss, or reaching her healthy weight goal.  [name]'s mom heard about our clinic and then she was referred by her PCP.  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  Eating out for breakfast and may be dinner.  Sometimes eats breakfast, 1 boiled egg or 1 Egg McMuffin (satisfied), goes back to sleep.  1 cup of mac & cheese or strawberries (1+ cup) then handful of pretzels.  Midnight, will get a yogurt with or without Ramen.  Drinks significant amount of fruit punch.  [name] is currently in the action stage of change and ready to dedicate time achieving and maintaining a healthier weight. [name] is interested in becoming our patient and working on intensive lifestyle modifications including (but not limited to) diet and exercise for weight loss.  [name]'s habits were reviewed today and are as follows: Her family eats meals together, her desired weight loss is 76 lbs, she started gaining weight in eighth or ninth grade, her heaviest weight ever was 220 pounds, she is a picky eater and doesn't like to eat healthier foods, she has significant food cravings issues, she snacks frequently in the evenings, she wakes up frequently in the middle of the night to eat, she skips meals frequently, she is frequently drinking liquids with calories, and she struggles with emotional eating.  Depression Screen [name]'s Food and Mood (modified PHQ-9) score was 2.     03/22/2022    8:35 AM  Depression screen PHQ 2/9  Decreased Interest 0  Down, Depressed, Hopeless 1  PHQ - 2 Score 1  Altered sleeping 0  Tired, decreased energy 0  Change in appetite 1   Feeling bad or failure about yourself  0  Trouble concentrating 0  Moving slowly or fidgety/restless 0  Suicidal thoughts 0  PHQ-9 Score 2  Difficult doing work/chores Not difficult at all   Subjective:   1. Other fatigue [name] admits to daytime somnolence and admits to waking up still tired. Patient has a history of symptoms of daytime fatigue and morning fatigue. [name] states she sleeps during the day and is awake at night, and states that she has nightime awakenings. Snoring is not present. Apneic episodes are not present. Epworth Sleepiness Score is 8.  EKG sinus tachycardia (similar to EKG done on 09/07/2019).  2. SOBOE (shortness of breath on exertion) Starkeisha notes increasing shortness of breath with exercising and seems to be worsening over time with weight gain. She notes getting out of breath sooner with activity than she used to. This has not gotten worse recently. [name] denies shortness of breath at rest or orthopnea.  3. PCOS (polycystic ovarian syndrome) [name] is on OCP, and her last A1c in 2019 was 5.4.  4. Vitamin D deficiency [name] is on vitamin D 50,000 units every 2 weeks, and she notes fatigue.  5. NAFLD (nonalcoholic fatty liver disease) [name] ultrasound done in 2019 showing steatosis.  LFTs were within normal limits.  6. Anxiety and depression [name] is managed by psychiatry, and she is on Pristiq.  She has been on Celexa, Lamictal, Zoloft, and Strattera.  Assessment/Plan:   1. Other fatigue [name] does feel  that her weight is causing her energy to be lower than it should be. Fatigue may be related to obesity, depression or many other causes. Labs will be ordered, and in the meanwhile, [name] will focus on self care including making healthy food choices, increasing physical activity and focusing on stress reduction.  - EKG 12-Lead - Vitamin B12 - Folate - T3 - T4, free  2. SOBOE (shortness of breath on exertion) [name] does feel that she  gets out of breath more easily that she used to when she exercises. [name]'s shortness of breath appears to be obesity related and exercise induced. She has agreed to work on weight loss and gradually increase exercise to treat her exercise induced shortness of breath. Will continue to monitor closely.  3. PCOS (polycystic ovarian syndrome) We will check labs today, and we will follow-up at [facility] next appointment.  - Hemoglobin A1c - Insulin, random  4. Vitamin D deficiency We will check labs today, and we will follow-up at [facility] next appointment.  - VITAMIN D 25 Hydroxy (Vit-D Deficiency, Fractures)  5. NAFLD (nonalcoholic fatty liver disease) We will check labs today, and we will follow-up at [facility] next appointment.  6. Anxiety and depression [name] will follow-up with psych at her next scheduled appointment.  7. Class 2 severe obesity with serious comorbidity and body mass index (BMI) of 39.0 to 39.9 in adult, unspecified obesity type ([name]) [name] is currently in the action stage of change and her goal is to continue with weight loss efforts. I recommend [name] begin the structured treatment plan as follows:  She has agreed to the Category 3 Plan.  Exercise goals: No exercise has been prescribed at this time.   Behavioral modification strategies: increasing lean protein intake, meal planning and cooking strategies, keeping healthy foods in the home, and planning for success.  She was informed of the importance of frequent follow-up visits to maximize her success with intensive lifestyle modifications for her multiple health conditions. She was informed we would discuss her lab results at her next visit unless there is a critical issue that needs to be addressed sooner. [name] agreed to keep her next visit at the agreed upon time to discuss these results.  Objective:   Blood pressure 114/82, pulse 98, temperature 98 F (36.7 C), height _0  (1.575 m), weight 216  lb (98 kg), last menstrual period 03/08/2022, SpO2 97 %. Body mass index is 39.51 kg/m.  EKG: Normal sinus rhythm, rate 111 BPM.  Indirect Calorimeter completed today shows a VO2 of 282 and a REE of 1944.  Her calculated basal metabolic rate is 3295 thus her basal metabolic rate is better than expected.  General: Cooperative, alert, well developed, in no acute distress. HEENT: Conjunctivae and lids unremarkable. Cardiovascular: Regular rhythm.  Lungs: Normal work of breathing. Neurologic: No focal deficits.   Lab Results  Component Value Date   CREATININE 0.7 03/09/2022   BUN 8 03/09/2022   NA 140 03/09/2022   K 4.5 03/09/2022   CL 103 03/09/2022   CO2 22 03/09/2022   Lab Results  Component Value Date   ALT 19 03/09/2022   AST 20 03/09/2022   ALKPHOS 78 03/09/2022   BILITOT 0.2 12/14/2020   Lab Results  Component Value Date   HGBA1C 5.5 03/22/2022   Lab Results  Component Value Date   INSULIN 37.1 (H) 03/22/2022   Lab Results  Component Value Date   TSH 2.69 03/09/2022   Lab Results  Component Value Date  CHOL 222 (A) 03/09/2022   HDL 78 (A) 03/09/2022   LDLCALC 119 03/09/2022   TRIG 148 03/09/2022   Lab Results  Component Value Date   WBC 13.7 03/09/2022   HGB 12.3 03/09/2022   HCT 39 03/09/2022   MCV 79 12/14/2020   PLT 439 (A) 03/09/2022   No results found for: "IRON", "TIBC", "FERRITIN"  Attestation Statements:   Reviewed by clinician on day of visit: allergies, medications, problem list, medical history, surgical history, family history, social history, and previous encounter notes.   I, Trixie Dredge, am acting as transcriptionist for Coralie Common, MD. This is the patient's first visit at [facility]. The patient's NEW PATIENT PACKET was reviewed at length. Included in the packet: current and past health history, medications, allergies, ROS, gynecologic history (women only), surgical history, family history, social history,  weight history, weight loss surgery history (for those that have had weight loss surgery), nutritional evaluation, mood and food questionnaire, PHQ9, Epworth questionnaire, sleep habits questionnaire, patient life and health improvement goals questionnaire. These will all be scanned into the patient's chart under media.   During the visit, I independently reviewed the patient's EKG, bioimpedance scale results, and indirect calorimeter results. I used this information to tailor a meal plan for the patient that will help her to lose weight and will improve her obesity-related conditions going forward. I performed a medically necessary appropriate examination and/or evaluation. I discussed the assessment and treatment plan with the patient. The patient was provided an opportunity to ask questions and all were answered. The patient agreed with the plan and demonstrated an understanding of the instructions. Labs were ordered at this visit and will be reviewed at the next visit unless more critical results need to be addressed immediately. Clinical information was updated and documented in the EMR.   Time spent on visit including pre-visit chart review and post-visit care was 40 minutes.  I have reviewed the above documentation for accuracy and completeness, and I agree with the above. - Coralie Common, MD

## 2022-04-05 ENCOUNTER — Encounter (INDEPENDENT_AMBULATORY_CARE_PROVIDER_SITE_OTHER): Payer: Self-pay | Admitting: Family Medicine

## 2022-04-05 ENCOUNTER — Ambulatory Visit (INDEPENDENT_AMBULATORY_CARE_PROVIDER_SITE_OTHER): Payer: Managed Care, Other (non HMO) | Admitting: Family Medicine

## 2022-04-05 VITALS — BP 121/78 | HR 99 | Temp 98.1°F | Ht 62.0 in | Wt 213.0 lb

## 2022-04-05 DIAGNOSIS — E8881 Metabolic syndrome: Secondary | ICD-10-CM

## 2022-04-05 DIAGNOSIS — E669 Obesity, unspecified: Secondary | ICD-10-CM

## 2022-04-05 DIAGNOSIS — E7849 Other hyperlipidemia: Secondary | ICD-10-CM

## 2022-04-05 DIAGNOSIS — E559 Vitamin D deficiency, unspecified: Secondary | ICD-10-CM

## 2022-04-05 DIAGNOSIS — Z6839 Body mass index (BMI) 39.0-39.9, adult: Secondary | ICD-10-CM

## 2022-04-05 MED ORDER — VITAMIN D (ERGOCALCIFEROL) 1.25 MG (50000 UNIT) PO CAPS: 50000.0000 [IU] | ORAL_CAPSULE | ORAL | 0 refills | Status: DC

## 2022-04-10 NOTE — Progress Notes (Signed)
Chief Complaint:   OBESITY [name] is here to discuss her progress with her obesity treatment plan along with follow-up of her obesity related diagnoses. [name] is on the Category 3 Plan and states she is following her eating plan approximately 65% of the time. [name] states she is exercising 0 minutes 0 times per week.  Today's visit was #: 2 Starting weight: 216 lbs Starting date: 03/22/2022 Today's weight: 213 lbs Today's date: 04/05/2022 Total lbs lost to date: 3 lbs Total lbs lost since last in-office visit: 3  Interim History: [name] was not able to finish the full meal. She does not like much meat---maybe sliced [place] and baked chicken. She voices she is nervous about college and moving in.  Subjective:   1. Vitamin D deficiency [name] is currently taking prescription Vit D 50,000 IU once every 14 days. She notes fatigue.  2. Insulin resistance [name]'s A1c was 5.5, insulin was 37.7. Pathophysiology of IR, Prediabetes, diabetes, and PCOS discussed.   3. Other hyperlipidemia [name]'s LDL at 119, HDL at 78, and Trigly at 148. She is not taking any medications; can not risk stratify due to age.  Assessment/Plan:   1. Vitamin D deficiency We will change Vit D to every 14 days for 1 month with 0 refills.  -Refill/change Vitamin D, Ergocalciferol, (DRISDOL) 1.25 MG (50000 UNIT) CAPS capsule; Take 1 capsule (50,000 Units total) by mouth every 7 (seven) days.  Dispense: 5 capsule; Refill: 0  2. Insulin resistance We will follow up with labs in 3-4 months.  3. Other hyperlipidemia Will repeat labs in 4 months.  4. Obesity with current BMI of 39.0 [name] is currently in the action stage of change. As such, her goal is to continue with weight loss efforts. She has agreed to the Pescatarian Plan +300.  Exercise goals: All adults should avoid inactivity. Some physical activity is better than none, and adults who participate in any amount of physical activity gain  some health benefits.  Behavioral modification strategies: increasing lean protein intake, meal planning and cooking strategies, keeping healthy foods in the home, and planning for success.  [name] has agreed to follow-up with our clinic in 3 weeks. She was informed of the importance of frequent follow-up visits to maximize her success with intensive lifestyle modifications for her multiple health conditions.   Objective:   Blood pressure 121/78, pulse 99, temperature 98.1 F (36.7 C), height 5\' 2"  (1.575 m), weight 213 lb (96.6 kg), last menstrual period 03/08/2022, SpO2 96 %. Body mass index is 38.96 kg/m.  General: Cooperative, alert, well developed, in no acute distress. HEENT: Conjunctivae and lids unremarkable. Cardiovascular: Regular rhythm.  Lungs: Normal work of breathing. Neurologic: No focal deficits.   Lab Results  Component Value Date   CREATININE 0.7 03/09/2022   BUN 8 03/09/2022   NA 140 03/09/2022   K 4.5 03/09/2022   CL 103 03/09/2022   CO2 22 03/09/2022   Lab Results  Component Value Date   ALT 19 03/09/2022   AST 20 03/09/2022   ALKPHOS 78 03/09/2022   BILITOT 0.2 12/14/2020   Lab Results  Component Value Date   HGBA1C 5.5 03/22/2022   Lab Results  Component Value Date   INSULIN 37.1 (H) 03/22/2022   Lab Results  Component Value Date   TSH 2.69 03/09/2022   Lab Results  Component Value Date   CHOL 222 (A) 03/09/2022   HDL 78 (A) 03/09/2022   LDLCALC 119 03/09/2022   TRIG 148  03/09/2022   Lab Results  Component Value Date   VD25OH 27.3 (L) 03/22/2022   Lab Results  Component Value Date   WBC 13.7 03/09/2022   HGB 12.3 03/09/2022   HCT 39 03/09/2022   MCV 79 12/14/2020   PLT 439 (A) 03/09/2022   No results found for: "IRON", "TIBC", "FERRITIN"  Attestation Statements:   Reviewed by clinician on day of visit: allergies, medications, problem list, medical history, surgical history, family history, social history, and previous  encounter notes.  Time spent on visit including pre-visit chart review and post-visit care and charting was 45 minutes.   I, Fortino Sic, RMA am acting as transcriptionist for Reuben Likes, MD. I have reviewed the above documentation for accuracy and completeness, and I agree with the above. - Reuben Likes, MD

## 2022-04-12 ENCOUNTER — Encounter (INDEPENDENT_AMBULATORY_CARE_PROVIDER_SITE_OTHER): Payer: Self-pay

## 2022-04-19 ENCOUNTER — Telehealth (INDEPENDENT_AMBULATORY_CARE_PROVIDER_SITE_OTHER): Payer: Managed Care, Other (non HMO) | Admitting: Family Medicine

## 2022-05-03 NOTE — Progress Notes (Deleted)
TeleHealth Visit:  This visit was completed with telemedicine (audio/video) technology. [name] has verbally consented to this TeleHealth visit. The patient is located at home, the provider is located at home. The participants in this visit include the listed provider and patient. The visit was conducted today via MyChart video.  OBESITY [name] is here to discuss her progress with her obesity treatment plan along with follow-up of her obesity related diagnoses.   Today's visit was # 3 Starting weight: 216 lbs Starting date: 03/22/2022 Weight at last in office visit: 213 lbs on 04/05/22 Total weight loss: 3 lbs at last in office visit on 04/05/22. Today's reported weight: *** lbs No weight reported.  Nutrition Plan: the Pescatarian Plan + 300 calories Hunger is {EWCONTROLASSESSMENT:24261}. Cravings are {EWCONTROLASSESSMENT:24261}.  Current exercise: {exercise types:16438}  Interim History: ***  Assessment/Plan:  1. ***  2. ***  3. ***  Obesity: Current BMI *** [name] {CHL AMB IS/IS NOT:210130109} currently in the action stage of change. As such, her goal is to {MWMwtloss#1:210800005}.  She has agreed to {MWMwtlossportion/plan2:23431}.   Exercise goals: {MWM EXERCISE RECS:23473}  Behavioral modification strategies: {MWMwtlossdietstrategies3:23432}.  [name] has agreed to follow-up with our clinic in {NUMBER 1-10:22536} weeks.   No orders of the defined types were placed in this encounter.   There are no discontinued medications.   No orders of the defined types were placed in this encounter.     Objective:   VITALS: Per patient if applicable, see vitals. GENERAL: Alert and in no acute distress. CARDIOPULMONARY: No increased WOB. Speaking in clear sentences.  PSYCH: Pleasant and cooperative. Speech normal rate and rhythm. Affect is appropriate. Insight and judgement are appropriate. Attention is focused, linear, and appropriate.  NEURO: Oriented as arrived to  appointment on time with no prompting.   Lab Results  Component Value Date   CREATININE 0.7 03/09/2022   BUN 8 03/09/2022   NA 140 03/09/2022   K 4.5 03/09/2022   CL 103 03/09/2022   CO2 22 03/09/2022   Lab Results  Component Value Date   ALT 19 03/09/2022   AST 20 03/09/2022   ALKPHOS 78 03/09/2022   BILITOT 0.2 12/14/2020   Lab Results  Component Value Date   HGBA1C 5.5 03/22/2022   Lab Results  Component Value Date   INSULIN 37.1 (H) 03/22/2022   Lab Results  Component Value Date   TSH 2.69 03/09/2022   Lab Results  Component Value Date   CHOL 222 (A) 03/09/2022   HDL 78 (A) 03/09/2022   LDLCALC 119 03/09/2022   TRIG 148 03/09/2022   Lab Results  Component Value Date   WBC 13.7 03/09/2022   HGB 12.3 03/09/2022   HCT 39 03/09/2022   MCV 79 12/14/2020   PLT 439 (A) 03/09/2022   No results found for: "IRON", "TIBC", "FERRITIN" Lab Results  Component Value Date   VD25OH 27.3 (L) 03/22/2022    Attestation Statements:   Reviewed by clinician on day of visit: allergies, medications, problem list, medical history, surgical history, family history, social history, and previous encounter notes.  ***(delete if time-based billing not used) Time spent on visit including the items listed below was *** minutes.  -preparing to see the patient (e.g., review of tests, history, previous notes) -obtaining and/or reviewing separately obtained history -counseling and educating the patient/family/caregiver -documenting clinical information in the electronic or other health record -ordering medications, tests, or procedures -independently interpreting results and communicating results to the patient/ family/caregiver -referring and communicating with other health  care professionals  -care coordination

## 2022-05-04 ENCOUNTER — Telehealth (INDEPENDENT_AMBULATORY_CARE_PROVIDER_SITE_OTHER): Payer: Managed Care, Other (non HMO) | Admitting: Family Medicine

## 2022-05-12 ENCOUNTER — Other Ambulatory Visit: Payer: Self-pay

## 2022-05-12 DIAGNOSIS — Z3041 Encounter for surveillance of contraceptive pills: Secondary | ICD-10-CM

## 2022-05-12 MED ORDER — DROSPIRENONE-ETHINYL ESTRADIOL 3-0.03 MG PO TABS
1.0000 | ORAL_TABLET | Freq: Every day | ORAL | 0 refills | Status: DC
Start: 2022-05-12 — End: 2022-06-22

## 2022-05-12 NOTE — Telephone Encounter (Signed)
Last AEX 02/15/2021--scheduled for 06/22/2022

## 2022-05-21 ENCOUNTER — Encounter: Payer: Self-pay | Admitting: Emergency Medicine

## 2022-05-21 ENCOUNTER — Ambulatory Visit
Admission: EM | Admit: 2022-05-21 | Discharge: 2022-05-21 | Disposition: A | Discharge status: Home / Self Care | Payer: Managed Care, Other (non HMO) | Attending: Family Medicine | Admitting: Family Medicine

## 2022-05-21 DIAGNOSIS — R109 Unspecified abdominal pain: Secondary | ICD-10-CM

## 2022-05-21 DIAGNOSIS — R3129 Other microscopic hematuria: Secondary | ICD-10-CM | POA: Diagnosis not present

## 2022-05-21 LAB — POCT URINALYSIS DIP (MANUAL ENTRY)
Bilirubin, UA: NEGATIVE
Glucose, UA: NEGATIVE mg/dL
Ketones, POC UA: NEGATIVE mg/dL
Leukocytes, UA: NEGATIVE
Nitrite, UA: NEGATIVE
Protein Ur, POC: NEGATIVE mg/dL
Spec Grav, UA: 1.02 (ref 1.010–1.025)
Urobilinogen, UA: 0.2 E.U./dL
pH, UA: 6 (ref 5.0–8.0)

## 2022-05-21 MED ORDER — CEPHALEXIN 500 MG PO CAPS: 500.0000 mg | ORAL_CAPSULE | Freq: Two times a day (BID) | ORAL | 0 refills | Status: DC

## 2022-05-21 MED ORDER — NAPROXEN SODIUM 550 MG PO TABS: 550.0000 mg | ORAL_TABLET | Freq: Two times a day (BID) | ORAL | 0 refills | Status: DC

## 2022-05-21 NOTE — ED Provider Notes (Signed)
[name]    CSN: [id number] Arrival date & time: 05/21/22  1403      History   Chief Complaint Chief Complaint  Patient presents with   Flank Pain    Bilateral     HPI [name] is a 21 y.o. female.   HPI  Patient has bilateral flank pain.  Started off on the left side.  Today its on the right side.  Sometimes it is both sides.  Heating pad helped.  She has not tried any medicine.  She states it is worse at times with movement.  Now on the right side it is radiating around into her abdomen.  No history of kidney stones or kidney infection.  No urinary symptoms.  Regular menses, last one 7 days ago.  No bleeding currently.  She did not do any lifting or activity that might of hurt her back.  Does not have any history of back problems or condition  Past Medical History:  Diagnosis Date   Acquired primary ovarian hypogonadism    ADD (attention deficit disorder)    Amenorrhea    when cycles first started age 24   Anxiety    Depression    Elevated prolactin level 09/16/2018   Taking Pamelor.   Gallbladder problem    Headache(784.0)    IBS (irritable bowel syndrome)    PCOS (polycystic ovarian syndrome)    PCOS (polycystic ovarian syndrome)     Patient Active Problem List   Diagnosis Date Noted   Chronic daily headache 01/12/2021   Anxiety 06/04/2020   Depressive disorder 06/04/2020   Abdominal pain 07/17/2018   NAFLD (nonalcoholic fatty liver disease) 05/01/2018   S/P nail surgery 01/27/2016   Ingrown toenail 01/20/2016   PCOS (polycystic ovarian syndrome) 10/08/2014   Tension headache 01/20/2013   Migraine without aura and without status migrainosus, not intractable 01/20/2013   ADD (attention deficit disorder) 01/27/2012    Past Surgical History:  Procedure Laterality Date   CHOLECYSTECTOMY     CHOLECYSTECTOMY  2018    OB History     Gravida  0   Para  0   Term  0   Preterm  0   AB  0   Living  0      SAB  0   IAB  0    Ectopic  0   Multiple  0   Live Births  0            Home Medications    Prior to Admission medications   Medication Sig Start Date End Date Taking? Authorizing Provider  cephALEXin (KEFLEX) 500 MG capsule Take 1 capsule (500 mg total) by mouth 2 (two) times daily. 05/21/22  Yes Raylene Everts, MD  naproxen sodium (ANAPROX DS) 550 MG tablet Take 1 tablet (550 mg total) by mouth 2 (two) times daily with a meal. 05/21/22  Yes Raylene Everts, MD  Acetaminophen (TYLENOL PO) Take 1 tablet by mouth daily as needed (pain). Fast acting    [provider]  desvenlafaxine (PRISTIQ) 25 MG 24 hr tablet Take 25 mg by mouth daily.    [provider]  dicyclomine (BENTYL) 20 MG tablet Take 20 mg by mouth as needed (pain).     [provider]  drospirenone-ethinyl estradiol (YASMIN) 3-0.03 MG tablet Take 1 tablet by mouth daily. 05/12/22   Nunzio Cobbs, MD  eletriptan (RELPAX) 40 MG tablet Take 40 mg by mouth as needed for  migraine or headache. May repeat in 2 hours if headache persists or recurs.    [provider]  gabapentin (NEURONTIN) 300 MG capsule Take 300 mg by mouth 2 (two) times daily.    [provider]  hyoscyamine (LEVSIN SL) 0.125 MG SL tablet Place under the tongue every 4 (four) hours as needed. 10/21/20   [provider]  ibuprofen (ADVIL) 200 MG tablet Take 200 mg by mouth every 6 (six) hours as needed.    [provider]  metroNIDAZOLE (METROGEL) 0.75 % gel Apply topically daily as needed. 11/25/21   [provider]  omeprazole (PRILOSEC) 20 MG capsule     [provider]  ondansetron (ZOFRAN-ODT) 4 MG disintegrating tablet Take 1-2 tablets (4-8 mg total) by mouth every 8 (eight) hours as needed. 12/14/20   Anson FretAhern, Antonia B, MD  QULIPTA 60 MG TABS Take 1 tablet by mouth daily. 05/06/22   [provider]  rizatriptan (MAXALT-MLT) 10 MG disintegrating tablet DISSOLVE 1 TABLET BY  MOUTH AS NEEDED FOR MIGRAINE. MAY REPEAT IN 2 HOURS IF NEEDED    [provider]  sodium fluoride (FLUORISHIELD) 1.1 % GEL dental gel PreviDent 5000 Dry Mouth 1.1 % dental paste    [provider]  Vitamin D, Ergocalciferol, (DRISDOL) 1.25 MG (50000 UNIT) CAPS capsule Take 1 capsule (50,000 Units total) by mouth every 7 (seven) days. 04/05/22   Langston ReusingUkleja, Alexandria U, MD    Family History Family History  Adopted: Yes    Social History Social History   Tobacco Use   Smoking status: Never   Smokeless tobacco: Never  Vaping Use   Vaping Use: Former  Substance Use Topics   Alcohol use: No   Drug use: No     Allergies   Latex   Review of Systems Review of Systems See HPI  Physical Exam Triage Vital Signs ED Triage Vitals  Enc Vitals Group     BP 05/21/22 1438 135/82     Pulse Rate 05/21/22 1438 (!) 110     Resp 05/21/22 1438 14     Temp 05/21/22 1438 98.2 F (36.8 C)     Temp Source 05/21/22 1438 Oral     SpO2 05/21/22 1438 96 %     Weight 05/21/22 1440 213 lb (96.6 kg)     Height 05/21/22 1440 5\' 2"  (1.575 m)     Head Circumference --      Peak Flow --      Pain Score 05/21/22 1440 2     Pain Loc --      Pain Edu? --      Excl. in GC? --    No data found.  Updated Vital Signs BP 135/82 (BP Location: Left Arm)   Pulse (!) 110   Temp 98.2 F (36.8 C) (Oral)   Resp 14   Ht 5\' 2"  (1.575 m)   Wt 96.6 kg   LMP 05/14/2022 (Exact Date)   SpO2 96%   BMI 38.96 kg/m      Physical Exam Constitutional:      General: She is not in acute distress.    Appearance: She is well-developed. She is obese.  HENT:     Head: Normocephalic and atraumatic.  Eyes:     Conjunctiva/sclera: Conjunctivae normal.     Pupils: Pupils are equal, round, and reactive to light.  Cardiovascular:     Rate and Rhythm: Normal rate and regular rhythm.     Heart sounds: Normal heart sounds.  Pulmonary:     Effort: Pulmonary effort is normal. No respiratory distress.      Breath sounds: Normal breath sounds.  Abdominal:     General: There is no distension.     Palpations: Abdomen is soft.     Tenderness: There is abdominal tenderness. There is right CVA tenderness. There is no left CVA tenderness.     Comments: There is mild tenderness with right CVA percussion.  Tenderness with palpation of the right lower rib border posteriorly.  Tenderness in the abdomen in the right middle and lower quadrants that is mild.  No guarding or rebound.  No mass palpable.  Musculoskeletal:        General: Normal range of motion.     Cervical back: Normal range of motion.  Skin:    General: Skin is warm and dry.  Neurological:     General: No focal deficit present.     Mental Status: She is alert.  Psychiatric:        Mood and Affect: Mood normal.        Behavior: Behavior normal.     Comments: Quiet demeanor      UC Treatments / Results  Labs (all labs ordered are listed, but only abnormal results are displayed) Labs Reviewed  POCT URINALYSIS DIP (MANUAL ENTRY) - Abnormal; Notable for the following components:      Result Value   Color, UA light yellow (*)    Clarity, UA cloudy (*)    Blood, UA trace-intact (*)    All other components within normal limits    EKG   Radiology No results found.  Procedures Procedures (including critical care time)  Medications Ordered in UC Medications - No data to display  Initial Impression / Assessment and Plan / UC Course  I have reviewed the triage vital signs and the nursing notes.  Pertinent labs & imaging results that were available during my care of the patient were reviewed by me and considered in my medical decision making (see chart for details).     Patient is here with her father.  She is a Archivist.  I discussed that her flank and back pain was not typical for kidney disease although she does have some hematuria.  Concern for urinary tract infection exists.  I believe it is more likely  musculoligamentous.  Recommend naproxen.  Will cover with antibiotics until culture report is available.  Explained that if this is a kidney stone she may get worse instead of better in which case she would have to go to an emergency room for evaluation Final Clinical Impressions(s) / UC Diagnoses   Final diagnoses:  Flank pain  Microscopic hematuria     Discharge Instructions      Take the antibiotic cephalexin 2 times a day Take naproxen 2 times a day with food.  This is an anti-inflammatory to help with pain Make sure you are drinking lots of water If you get worse instead of better, go to an emergency room   ED Prescriptions     Medication Sig Dispense Auth. Provider   naproxen sodium (ANAPROX DS) 550 MG tablet Take 1 tablet (550 mg total) by mouth 2 (two) times daily with a meal. 30 tablet Eustace Moore, MD   cephALEXin (KEFLEX) 500 MG capsule Take 1 capsule (500 mg total) by mouth 2 (two) times daily. 10 capsule Eustace Moore, MD      PDMP not reviewed this encounter.   Rica Mast  Fannie Knee, MD 05/21/22 [phone]

## 2022-05-21 NOTE — ED Triage Notes (Signed)
Bilateral flank pain x 1 week  No OTC meds Heating pad helped  Denies chills  Here with dad

## 2022-05-21 NOTE — Discharge Instructions (Signed)
Take the antibiotic cephalexin 2 times a day Take naproxen 2 times a day with food.  This is an anti-inflammatory to help with pain Make sure you are drinking lots of water If you get worse instead of better, go to an emergency room

## 2022-05-23 LAB — URINE CULTURE: Culture: <10000 — AB

## 2022-05-24 ENCOUNTER — Ambulatory Visit
Admission: RE | Admit: 2022-05-24 | Discharge: 2022-05-24 | Disposition: A | Discharge status: Home / Self Care | Payer: Managed Care, Other (non HMO) | Source: Ambulatory Visit | Attending: Adult Health

## 2022-05-24 ENCOUNTER — Other Ambulatory Visit: Payer: Self-pay | Admitting: Adult Health

## 2022-05-24 DIAGNOSIS — R1030 Lower abdominal pain, unspecified: Secondary | ICD-10-CM

## 2022-05-24 MED ORDER — IOPAMIDOL (ISOVUE-300) INJECTION 61%
80.0000 mL | Freq: Once | INTRAVENOUS | Status: AC | PRN
  Administered 2022-05-24: 80 mL via INTRAVENOUS

## 2022-05-29 ENCOUNTER — Telehealth: Payer: Self-pay | Admitting: *Deleted

## 2022-05-29 ENCOUNTER — Encounter: Payer: Self-pay | Admitting: Obstetrics and Gynecology

## 2022-05-29 NOTE — Telephone Encounter (Addendum)
Patient dad Elta Guadeloupe called patient is away at [employer] in [place]. Dad reports about 2 weeks patient has been having flack back pain varies from right to left pain scale 2/3 out of 10 highest has been a 7 out of 10. Patient was seen at Urgent care notes in epic on 05/21/22 ct scan was ordered. Patient also saw her PCP at [facility] center and was prescribed Cipro to rule out any infection per dad. Dad asked if this pain could related to right ovarian cyst notes on CT scan? Patient is scheduled for annual exam on fall break on 06/22/22.  Please see my chart message from patient as well from Onset information.   Please advise

## 2022-05-30 NOTE — Telephone Encounter (Signed)
Patient dad Elta Guadeloupe informed and will reach out to PCP.

## 2022-05-30 NOTE — Telephone Encounter (Signed)
I reviewed her CT scan report and the records that [facility] sent.  I do not think her back pain and potential infection are caused by the ovarian cyst.   If she develops right lower quadrant pain, I recommend she be seen before her routine office appointment with me in October.

## 2022-06-06 ENCOUNTER — Telehealth: Payer: Self-pay

## 2022-06-06 NOTE — Telephone Encounter (Signed)
Father called stating that patient suspects she has yeast infection but the AZO she bought OTC is not helping her.    I explained to him that AZO is to soothe bladder spasms and urinary tract pain/burning. I told him this makes me wonder if patient suspects UTI.  He said her Mom thought she had yeast infection as well as patient. He was asking if Dr. Quincy Simmonds would prescribe something because patient is at college. She has AEX 06/22/22.  I explained that Dr. Quincy Simmonds does not treat infections over the phone and that Dr. Quincy Simmonds will recommend that patient be seen as Student [facility] or walk in Urgent Care of identify her symptoms and help her with treatment.    He voice understanding and thanked me for explaining it all to him.

## 2022-06-06 NOTE — Telephone Encounter (Signed)
I do agree with the patient being seen for evaluation.  Encounter reviewed and closed.

## 2022-06-20 NOTE — Progress Notes (Unsigned)
21 y.o. G53P0000 Single Hispanic female here for annual exam.    PCP:  Cristie Hem MD   No LMP recorded. (Menstrual status: Oral contraceptives).           Sexually active: {yes no:314532}  The current method of family planning is {contraception:315051}.    Exercising: {yes no:314532}  {types:19826} Smoker:  {YES NO:22349}  Health Maintenance: Pap:  n/a History of abnormal Pap:  no MMG:  n/a Colonoscopy:  n/a BMD:   n/a  Result   TDaP:  01/30/22 Gardasil:   x1 HIV: no Hep C:no Screening Labs:  Hb today: ***, Urine today: ***   reports that she has never smoked. She has never used smokeless tobacco. She reports that she does not drink alcohol and does not use drugs.  Past Medical History:  Diagnosis Date   Acquired primary ovarian hypogonadism    ADD (attention deficit disorder)    Amenorrhea    when cycles first started age 27   Anxiety    Depression    Elevated prolactin level 09/16/2018   Taking Pamelor.   Gallbladder problem    Headache(784.0)    IBS (irritable bowel syndrome)    PCOS (polycystic ovarian syndrome)    PCOS (polycystic ovarian syndrome)     Past Surgical History:  Procedure Laterality Date   CHOLECYSTECTOMY     CHOLECYSTECTOMY  2018    Current Outpatient Medications  Medication Sig Dispense Refill   Acetaminophen (TYLENOL PO) Take 1 tablet by mouth daily as needed (pain). Fast acting     cephALEXin (KEFLEX) 500 MG capsule Take 1 capsule (500 mg total) by mouth 2 (two) times daily. 10 capsule 0   desvenlafaxine (PRISTIQ) 25 MG 24 hr tablet Take 25 mg by mouth daily.     dicyclomine (BENTYL) 20 MG tablet Take 20 mg by mouth as needed (pain).      drospirenone-ethinyl estradiol (YASMIN) 3-0.03 MG tablet Take 1 tablet by mouth daily. 84 tablet 0   eletriptan (RELPAX) 40 MG tablet Take 40 mg by mouth as needed for migraine or headache. May repeat in 2 hours if headache persists or recurs.     gabapentin (NEURONTIN) 300 MG capsule Take 300 mg by mouth  2 (two) times daily.     hyoscyamine (LEVSIN SL) 0.125 MG SL tablet Place under the tongue every 4 (four) hours as needed.     ibuprofen (ADVIL) 200 MG tablet Take 200 mg by mouth every 6 (six) hours as needed.     metroNIDAZOLE (METROGEL) 0.75 % gel Apply topically daily as needed.     naproxen sodium (ANAPROX DS) 550 MG tablet Take 1 tablet (550 mg total) by mouth 2 (two) times daily with a meal. 30 tablet 0   omeprazole (PRILOSEC) 20 MG capsule      ondansetron (ZOFRAN-ODT) 4 MG disintegrating tablet Take 1-2 tablets (4-8 mg total) by mouth every 8 (eight) hours as needed. 30 tablet 3   QULIPTA 60 MG TABS Take 1 tablet by mouth daily.     rizatriptan (MAXALT-MLT) 10 MG disintegrating tablet DISSOLVE 1 TABLET BY MOUTH AS NEEDED FOR MIGRAINE. MAY REPEAT IN 2 HOURS IF NEEDED     sodium fluoride (FLUORISHIELD) 1.1 % GEL dental gel PreviDent 5000 Dry Mouth 1.1 % dental paste     Vitamin D, Ergocalciferol, (DRISDOL) 1.25 MG (50000 UNIT) CAPS capsule Take 1 capsule (50,000 Units total) by mouth every 7 (seven) days. 5 capsule 0   No current facility-administered medications for this  visit.    Family History  Adopted: Yes    Review of Systems  Exam:   There were no vitals taken for this visit.    General appearance: alert, cooperative and appears stated age Head: normocephalic, without obvious abnormality, atraumatic Neck: no adenopathy, supple, symmetrical, trachea midline and thyroid normal to inspection and palpation Lungs: clear to auscultation bilaterally Breasts: normal appearance, no masses or tenderness, No nipple retraction or dimpling, No nipple discharge or bleeding, No axillary adenopathy Heart: regular rate and rhythm Abdomen: soft, non-tender; no masses, no organomegaly Extremities: extremities normal, atraumatic, no cyanosis or edema Skin: skin color, texture, turgor normal. No rashes or lesions Lymph nodes: cervical, supraclavicular, and axillary nodes normal. Neurologic:  grossly normal  Pelvic: External genitalia:  no lesions              No abnormal inguinal nodes palpated.              Urethra:  normal appearing urethra with no masses, tenderness or lesions              Bartholins and Skenes: normal                 Vagina: normal appearing vagina with normal color and discharge, no lesions              Cervix: no lesions              Pap taken: {yes no:314532} Bimanual Exam:  Uterus:  normal size, contour, position, consistency, mobility, non-tender              Adnexa: no mass, fullness, tenderness              Rectal exam: {yes no:314532}.  Confirms.              Anus:  normal sphincter tone, no lesions  Chaperone was present for exam:  ***  Assessment:   Well woman visit with gynecologic exam.   Plan: Mammogram screening discussed. Self breast awareness reviewed. Pap and HR HPV as above. Guidelines for Calcium, Vitamin D, regular exercise program including cardiovascular and weight bearing exercise.   Follow up annually and prn.   Additional counseling given.  {yes [id number]. _______ minutes face to face time of which over 50% was spent in counseling.    After visit summary provided.

## 2022-06-21 ENCOUNTER — Encounter (INDEPENDENT_AMBULATORY_CARE_PROVIDER_SITE_OTHER): Payer: Self-pay | Admitting: Family Medicine

## 2022-06-21 ENCOUNTER — Ambulatory Visit (INDEPENDENT_AMBULATORY_CARE_PROVIDER_SITE_OTHER): Payer: Managed Care, Other (non HMO) | Admitting: Family Medicine

## 2022-06-21 VITALS — BP 104/73 | HR 104 | Temp 97.8°F | Ht 62.0 in | Wt 200.0 lb

## 2022-06-21 DIAGNOSIS — E88819 Insulin resistance, unspecified: Secondary | ICD-10-CM | POA: Diagnosis not present

## 2022-06-21 DIAGNOSIS — E669 Obesity, unspecified: Secondary | ICD-10-CM | POA: Diagnosis not present

## 2022-06-21 DIAGNOSIS — Z6839 Body mass index (BMI) 39.0-39.9, adult: Secondary | ICD-10-CM

## 2022-06-21 DIAGNOSIS — E559 Vitamin D deficiency, unspecified: Secondary | ICD-10-CM

## 2022-06-21 DIAGNOSIS — R109 Unspecified abdominal pain: Secondary | ICD-10-CM

## 2022-06-21 DIAGNOSIS — E7849 Other hyperlipidemia: Secondary | ICD-10-CM | POA: Insufficient documentation

## 2022-06-21 DIAGNOSIS — E66812 Obesity, class 2: Secondary | ICD-10-CM | POA: Insufficient documentation

## 2022-06-21 DIAGNOSIS — E119 Type 2 diabetes mellitus without complications: Secondary | ICD-10-CM

## 2022-06-21 MED ORDER — VITAMIN D (ERGOCALCIFEROL) 1.25 MG (50000 UNIT) PO CAPS: 50000.0000 [IU] | ORAL_CAPSULE | ORAL | 0 refills | Status: DC

## 2022-06-21 NOTE — Progress Notes (Signed)
Office: [phone]  /  Fax: [phone]   Total lbs lost to date: 16 Total lbs lost since last in-office visit: 13     BP 104/73   Pulse (!) 104   Temp 97.8 F (36.6 C)   Ht 5\' 2"  (1.575 m)   Wt 200 lb (90.7 kg)   SpO2 91%   BMI 36.58 kg/m  She was weighed on the bioimpedance scale:  Body mass index is 36.58 kg/m.  General:  Alert, oriented and cooperative. Patient is in no acute distress.  Mental Status: Normal mood and affect. Normal behavior. Normal judgment and thought content.        Patient past medical history includes:   Past Medical History:  Diagnosis Date   Acquired primary ovarian hypogonadism    ADD (attention deficit disorder)    Amenorrhea    when cycles first started age 29   Anxiety    Depression    Diabetes mellitus ([place]) 06/21/2022   Elevated prolactin level 09/16/2018   Taking Pamelor.   Gallbladder problem    Headache(784.0)    IBS (irritable bowel syndrome)    PCOS (polycystic ovarian syndrome)    PCOS (polycystic ovarian syndrome)    History of Present Illness The patient has been experiencing significant weight loss and has been working on managing their diet, particularly in the context of their college cafeteria food options. They have been focusing on consuming high protein foods to help with blood sugar control and satiety. The patient has a history of insulin resistance, with well-controlled blood sugars but high insulin levels. They have been advised to consume more protein to help lower insulin levels and manage hunger signals.  In the past five weeks, the patient has been experiencing sharp, stabbing pain in their upper sides after eating, lasting for about 15 minutes. The pain is not alleviated by any specific actions and occurs on both sides, though it may vary in location. The patient has a history of being diagnosed with irritable bowel syndrome (IBS) several years ago, but it is unclear if this is related to their current  symptoms. They have also had their gallbladder removed in the past.  The patient has been taking vitamin D supplements, which need to be refilled.    They have been advised to aim for 150 minutes of physical activity per week for overall health and weight loss benefits. The patient is willing to fast for their next blood work appointment to recheck insulin levels and monitor their progress.  Assessment & Plan Obesity: Successful weight loss, but challenges with maintaining a healthy diet due to college cafeteria food. -Provide a list of 100 calorie snacks for more variety in diet. -Continue cat 3 plan.  Insulin Resistance: High insulin levels despite good blood sugar control, indicating insulin resistance. -Encourage continued weight loss and protein-rich diet to reduce insulin resistance.  Abdominal Pain: Sharp, stabbing pain in upper sides after eating, lasting about 15 minutes, ongoing for five weeks. -Suspect esophageal spasm. Recommend eating slower and thoroughly chewing food. Continue pursuing appointment with gastroenterologist for further evaluation.  Vitamin D Deficiency: Requires refill of Vitamin D prescription. -Send prescription to pharmacy.  Physical Activity: Encourage 150 minutes of moderate-intensity exercise per week, spread out over multiple days.  Follow-up in 4-5 weeks with fasting blood work to recheck insulin levels.  Dennard Nip, MD  I have personally spent 46 minutes total time today in preparation, patient care, and documentation for this visit, including the following: review of clinical  lab tests; review of medical tests/procedures/services.

## 2022-06-22 ENCOUNTER — Ambulatory Visit: Payer: Managed Care, Other (non HMO) | Admitting: Obstetrics and Gynecology

## 2022-06-22 ENCOUNTER — Encounter: Payer: Self-pay | Admitting: Obstetrics and Gynecology

## 2022-06-22 VITALS — BP 110/80 | HR 77 | Ht 61.0 in | Wt 199.0 lb

## 2022-06-22 DIAGNOSIS — Z23 Encounter for immunization: Secondary | ICD-10-CM | POA: Diagnosis not present

## 2022-06-22 DIAGNOSIS — Z3041 Encounter for surveillance of contraceptive pills: Secondary | ICD-10-CM

## 2022-06-22 DIAGNOSIS — Z01419 Encounter for gynecological examination (general) (routine) without abnormal findings: Secondary | ICD-10-CM

## 2022-06-22 MED ORDER — DROSPIRENONE-ETHINYL ESTRADIOL 3-0.03 MG PO TABS: 1.0000 | ORAL_TABLET | Freq: Every day | ORAL | 3 refills | Status: DC

## 2022-06-22 NOTE — Patient Instructions (Addendum)
Please check with your neurologist to see if you have migraine with aura.  If you do, we will switch your birth control to one without estrogen in it.  EXERCISE AND DIET:  We recommended that you start or continue a regular exercise program for good health. Regular exercise means any activity that makes your heart beat faster and makes you sweat.  We recommend exercising at least 30 minutes per day at least 3 days a week, preferably 4 or 5.  We also recommend a diet low in fat and sugar.  Inactivity, poor dietary choices and obesity can cause diabetes, heart attack, stroke, and kidney damage, among others.    ALCOHOL AND SMOKING:  Women should limit their alcohol intake to no more than 7 drinks/beers/glasses of wine (combined, not each!) per week. Moderation of alcohol intake to this level decreases your risk of breast cancer and liver damage. And of course, no recreational drugs are part of a healthy lifestyle.  And absolutely no smoking or even second hand smoke. Most people know smoking can cause heart and lung diseases, but did you know it also contributes to weakening of your bones? Aging of your skin?  Yellowing of your teeth and nails?  CALCIUM AND VITAMIN D:  Adequate intake of calcium and Vitamin D are recommended.  The recommendations for exact amounts of these supplements seem to change often, but generally speaking 600 mg of calcium (either carbonate or citrate) and 800 units of Vitamin D per day seems prudent. Certain women may benefit from higher intake of Vitamin D.  If you are among these women, your doctor will have told you during your visit.    PAP SMEARS:  Pap smears, to check for cervical cancer or precancers,  have traditionally been done yearly, although recent scientific advances have shown that most women can have pap smears less often.  However, every woman still should have a physical exam from her gynecologist every year. It will include a breast check, inspection of the vulva  and vagina to check for abnormal growths or skin changes, a visual exam of the cervix, and then an exam to evaluate the size and shape of the uterus and ovaries.  And after 20 years of age, a rectal exam is indicated to check for rectal cancers. We will also provide age appropriate advice regarding health maintenance, like when you should have certain vaccines, screening for sexually transmitted diseases, bone density testing, colonoscopy, mammograms, etc.   MAMMOGRAMS:  All women over 67 years old should have a yearly mammogram. Many facilities now offer a "3D" mammogram, which may cost around $50 extra out of pocket. If possible,  we recommend you accept the option to have the 3D mammogram performed.  It both reduces the number of women who will be called back for extra views which then turn out to be normal, and it is better than the routine mammogram at detecting truly abnormal areas.    COLONOSCOPY:  Colonoscopy to screen for colon cancer is recommended for all women at age 50.  We know, you hate the idea of the prep.  We agree, BUT, having colon cancer and not knowing it is worse!!  Colon cancer so often starts as a polyp that can be seen and removed at colonscopy, which can quite literally save your life!  And if your first colonoscopy is normal and you have no family history of colon cancer, most women don't have to have it again for 10 years.  Once  every ten years, you can do something that may end up saving your life, right?  We will be happy to help you get it scheduled when you are ready.  Be sure to check your insurance coverage so you understand how much it will cost.  It may be covered as a preventative service at no cost, but you should check your particular policy.

## 2022-07-23 ENCOUNTER — Encounter: Payer: Self-pay | Admitting: Obstetrics and Gynecology

## 2022-07-24 ENCOUNTER — Encounter: Payer: Self-pay | Admitting: Obstetrics and Gynecology

## 2022-08-23 ENCOUNTER — Ambulatory Visit (INDEPENDENT_AMBULATORY_CARE_PROVIDER_SITE_OTHER): Payer: Managed Care, Other (non HMO) | Admitting: Family Medicine

## 2022-08-23 ENCOUNTER — Encounter (INDEPENDENT_AMBULATORY_CARE_PROVIDER_SITE_OTHER): Payer: Self-pay | Admitting: Family Medicine

## 2022-08-23 VITALS — BP 115/75 | HR 94 | Temp 98.4°F | Ht 62.0 in | Wt 198.0 lb

## 2022-08-23 DIAGNOSIS — E559 Vitamin D deficiency, unspecified: Secondary | ICD-10-CM

## 2022-08-23 DIAGNOSIS — E7849 Other hyperlipidemia: Secondary | ICD-10-CM | POA: Diagnosis not present

## 2022-08-23 DIAGNOSIS — Z6836 Body mass index (BMI) 36.0-36.9, adult: Secondary | ICD-10-CM

## 2022-08-23 DIAGNOSIS — E88819 Insulin resistance, unspecified: Secondary | ICD-10-CM | POA: Diagnosis not present

## 2022-08-23 DIAGNOSIS — E669 Obesity, unspecified: Secondary | ICD-10-CM | POA: Diagnosis not present

## 2022-08-23 MED ORDER — VITAMIN D (ERGOCALCIFEROL) 1.25 MG (50000 UNIT) PO CAPS: 50000.0000 [IU] | ORAL_CAPSULE | ORAL | 0 refills | Status: DC

## 2022-08-24 LAB — COMPREHENSIVE METABOLIC PANEL
ALT: 18 IU/L (ref 0–32)
AST: 15 IU/L (ref 0–40)
Albumin/Globulin Ratio: 1.2 (ref 1.2–2.2)
Albumin: 4.1 g/dL (ref 4.0–5.0)
Alkaline Phosphatase: 88 IU/L (ref 42–106)
BUN/Creatinine Ratio: 10 (ref 9–23)
BUN: 7 mg/dL (ref 6–20)
Bilirubin Total: <0.2 mg/dL (ref 0.0–1.2)
CO2: 20 mmol/L (ref 20–29)
Calcium: 9.5 mg/dL (ref 8.7–10.2)
Chloride: 101 mmol/L (ref 96–106)
Creatinine, Ser: 0.71 mg/dL (ref 0.57–1.00)
Globulin, Total: 3.4 g/dL (ref 1.5–4.5)
Glucose: 88 mg/dL (ref 70–99)
Potassium: 4.4 mmol/L (ref 3.5–5.2)
Sodium: 137 mmol/L (ref 134–144)
Total Protein: 7.5 g/dL (ref 6.0–8.5)
eGFR: 125 mL/min/{1.73_m2} (ref >59)

## 2022-08-24 LAB — LIPID PANEL WITH LDL/HDL RATIO
Cholesterol, Total: 195 mg/dL (ref 100–199)
HDL: 79 mg/dL (ref >39)
LDL Chol Calc (NIH): 93 mg/dL (ref 0–99)
LDL/HDL Ratio: 1.2 ratio (ref 0.0–3.2)
Triglycerides: 134 mg/dL (ref 0–149)
VLDL Cholesterol Cal: 23 mg/dL (ref 5–40)

## 2022-08-24 LAB — HEMOGLOBIN A1C
Est. average glucose Bld gHb Est-mCnc: 108 mg/dL
Hgb A1c MFr Bld: 5.4 % (ref 4.8–5.6)

## 2022-08-24 LAB — VITAMIN D 25 HYDROXY (VIT D DEFICIENCY, FRACTURES): Vit D, 25-Hydroxy: 22.3 ng/mL — ABNORMAL LOW (ref 30.0–100.0)

## 2022-08-24 LAB — INSULIN, RANDOM: INSULIN: 24.8 u[IU]/mL (ref 2.6–24.9)

## 2022-09-06 NOTE — Progress Notes (Signed)
Chief Complaint:   OBESITY [name] is here to discuss her progress with her obesity treatment plan along with follow-up of her obesity related diagnoses. [name] is on the Category 3 Plan and states she is following her eating plan approximately 30% of the time. [name] states she is yoga 60 minutes 2 times per week.  Today's visit was #: 4 Starting weight: 216 lbs Starting date: 03/22/2022 Today's weight: 198 lbs Today's date: 08/23/2022 Total lbs lost to date: 18 lbs Total lbs lost since last in-office visit: 0  Interim History: [name] returned from semester away at school. She voices she did not have many options that were nutrious. Supper would be maybe [place]. Often ate at her dorm due to lack of options. Staying local for the holidays.  Subjective:   1. Vitamin D deficiency [name] is currently taking prescription Vit D 50,000 IU once a week. Lack Vit D level decrease.  2. Insulin resistance [name]'s last A1c was within normal limits but insulin elevated.  3. Other hyperlipidemia [name]'s LDL slightly elevated. Not on medications.  Assessment/Plan:   1. Vitamin D deficiency We will obtain labs today. We will refill Vit D 50K IU once a week for 1 month with 0 refills.  - VITAMIN D 25 Hydroxy (Vit-D Deficiency, Fractures)  -Refill Vitamin D, Ergocalciferol, (DRISDOL) 1.25 MG (50000 UNIT) CAPS capsule; Take 1 capsule (50,000 Units total) by mouth every 7 (seven) days.  Dispense: 12 capsule; Refill: 0  2. Insulin resistance We will obtain labs today.  - Comprehensive metabolic panel - Hemoglobin A1c - Insulin, random  3. Other hyperlipidemia We will obtain labs today.  - Lipid Panel With LDL/HDL Ratio  4. Obesity, Current BMI 36.2 [name] is currently in the action stage of change. As such, her goal is to continue with weight loss efforts. She has agreed to the Category 3 Plan and keeping a food journal and adhering to recommended goals of 1500-1600  calories and 90+ grams of protein daily.   Exercise goals: All adults should avoid inactivity. Some physical activity is better than none, and adults who participate in any amount of physical activity gain some health benefits.  Behavioral modification strategies: increasing lean protein intake, meal planning and cooking strategies, keeping healthy foods in the home, and planning for success.  [name] has agreed to follow-up with our clinic in 3 weeks. She was informed of the importance of frequent follow-up visits to maximize her success with intensive lifestyle modifications for her multiple health conditions.   [name] was informed we would discuss her lab results at her next visit unless there is a critical issue that needs to be addressed sooner. [name] agreed to keep her next visit at the agreed upon time to discuss these results.  Objective:   Blood pressure 115/75, pulse 94, temperature 98.4 F (36.9 C), height 5\' 2"  (1.575 m), weight 198 lb (89.8 kg), SpO2 97 %. Body mass index is 36.21 kg/m.  General: Cooperative, alert, well developed, in no acute distress. HEENT: Conjunctivae and lids unremarkable. Cardiovascular: Regular rhythm.  Lungs: Normal work of breathing. Neurologic: No focal deficits.   Lab Results  Component Value Date   CREATININE 0.71 08/23/2022   BUN 7 08/23/2022   NA 137 08/23/2022   K 4.4 08/23/2022   CL 101 08/23/2022   CO2 20 08/23/2022   Lab Results  Component Value Date   ALT 18 08/23/2022   AST 15 08/23/2022   ALKPHOS 88 08/23/2022   BILITOT <0.2  08/23/2022   Lab Results  Component Value Date   HGBA1C 5.4 08/23/2022   HGBA1C 5.5 03/22/2022   Lab Results  Component Value Date   INSULIN 24.8 08/23/2022   INSULIN 37.1 (H) 03/22/2022   Lab Results  Component Value Date   TSH 2.69 03/09/2022   Lab Results  Component Value Date   CHOL 195 08/23/2022   HDL 79 08/23/2022   LDLCALC 93 08/23/2022   TRIG 134 08/23/2022   Lab Results   Component Value Date   VD25OH 22.3 (L) 08/23/2022   VD25OH 27.3 (L) 03/22/2022   Lab Results  Component Value Date   WBC 13.7 03/09/2022   HGB 12.3 03/09/2022   HCT 39 03/09/2022   MCV 79 12/14/2020   PLT 439 (A) 03/09/2022   No results found for: "IRON", "TIBC", "FERRITIN"  Attestation Statements:   Reviewed by clinician on day of visit: allergies, medications, problem list, medical history, surgical history, family history, social history, and previous encounter notes.  I, Elnora Morrison, RMA am acting as transcriptionist for Coralie Common, MD.  I have reviewed the above documentation for accuracy and completeness, and I agree with the above. - Coralie Common, MD

## 2022-09-07 ENCOUNTER — Ambulatory Visit (INDEPENDENT_AMBULATORY_CARE_PROVIDER_SITE_OTHER): Payer: Managed Care, Other (non HMO) | Admitting: Physician Assistant

## 2022-09-11 ENCOUNTER — Encounter (INDEPENDENT_AMBULATORY_CARE_PROVIDER_SITE_OTHER): Payer: Self-pay | Admitting: Physician Assistant

## 2022-09-11 ENCOUNTER — Ambulatory Visit (INDEPENDENT_AMBULATORY_CARE_PROVIDER_SITE_OTHER): Payer: Managed Care, Other (non HMO) | Admitting: Physician Assistant

## 2022-09-11 VITALS — BP 102/66 | HR 65 | Temp 98.1°F | Ht 62.0 in | Wt 199.0 lb

## 2022-09-11 DIAGNOSIS — E88819 Insulin resistance, unspecified: Secondary | ICD-10-CM | POA: Diagnosis not present

## 2022-09-11 DIAGNOSIS — E559 Vitamin D deficiency, unspecified: Secondary | ICD-10-CM | POA: Diagnosis not present

## 2022-09-11 DIAGNOSIS — E669 Obesity, unspecified: Secondary | ICD-10-CM | POA: Diagnosis not present

## 2022-09-11 DIAGNOSIS — E7849 Other hyperlipidemia: Secondary | ICD-10-CM

## 2022-09-11 DIAGNOSIS — Z6836 Body mass index (BMI) 36.0-36.9, adult: Secondary | ICD-10-CM

## 2022-09-11 MED ORDER — VITAMIN D (ERGOCALCIFEROL) 1.25 MG (50000 UNIT) PO CAPS: 50000.0000 [IU] | ORAL_CAPSULE | ORAL | 0 refills | Status: DC

## 2022-09-18 NOTE — Progress Notes (Unsigned)
Chief Complaint:   OBESITY [name] is here to discuss her progress with her obesity treatment plan along with follow-up of her obesity related diagnoses. [name] is on the Category 3 Plan and states she is following her eating plan approximately 50% of the time. [name] states she is not currently exercising.  Today's visit was #: 5 Starting weight: 216 lbs Starting date: 03/22/2022 Today's weight: 199 lbs Today's date: 09/11/2022 Total lbs lost to date: 17 Total lbs lost since last in-office visit: +1  Interim History: [name] has done well overall with weight loss. She is in college and has limited dietary choices on her meal plan at school.  Breakfast- yogurt or skips Lunch- chicken/vegetable Dinner- chicken/vegetable Snacks- [place] She is eating some fried foods due to limited options at college. Pt walks to class and takes stairs to increase movement.   Pt's Dad was present during visit today.  Subjective:   1. Vitamin D deficiency Discussed labs with patient today. [name] is taking Ergocalciferol once weekly with no side effects. Vitamin D level 22.3- Not at goal.  2. Insulin resistance Discussed labs with patient today. [name]'s A1c has decreased to 5.4 with an insulin level of 24.8 which has improved but is not at goal yet. She is not on medications for IR, but continues to decrease simple carbs and increase lean protein and exercise to promote weight loss.   3. Other hyperlipidemia Discussed labs with patient today. [name]'s cholesterol panel has improved. Her LDL decreased to 93, HDL increased to 79, and triglycerides decreased to 134. All at goals. She is not taking any medications for lipid control.   Assessment/Plan:   1. Vitamin D deficiency Continue Ergocalciferol. Follow up on labs 2-3 times a year to avoid over supplementation.  Refill- Vitamin D, Ergocalciferol, (DRISDOL) 1.25 MG (50000 UNIT) CAPS capsule; Take 1 capsule (50,000 Units total) by mouth  every 7 (seven) days.  Dispense: 12 capsule; Refill: 0  2. Insulin resistance Continue prudent nutritional plan to decrease simple carbohydrates, increase lean protein and exercise to promote weight loss and improve IR. Follow up labs in 2-3 months.  3. Other hyperlipidemia Continue prudent nutritional plan to decrease saturated fats and cholesterol and exercise to promote weight loss and improve lipid profile.  4. Obesity, Current BMI 36.5 [name] is currently in the action stage of change. As such, her goal is to continue with weight loss efforts. She has agreed to the Category 3 Plan.   Exercise goals: All adults should avoid inactivity. Some physical activity is better than none, and adults who participate in any amount of physical activity gain some health benefits.  Behavioral modification strategies: increasing lean protein intake, decreasing simple carbohydrates, better snacking choices, and planning for success.  [name] has agreed to follow-up with our clinic in 8 weeks with Dr. Jearld Shines during college break in March. She was informed of the importance of frequent follow-up visits to maximize her success with intensive lifestyle modifications for her multiple health conditions.   Objective:   Blood pressure 102/66, pulse 65, temperature 98.1 F (36.7 C), height 5\' 2"  (1.575 m), weight 199 lb (90.3 kg), SpO2 100 %. Body mass index is 36.4 kg/m.  General: Cooperative, alert, well developed, in no acute distress. HEENT: Conjunctivae and lids unremarkable. Cardiovascular: Regular rhythm.  Lungs: Normal work of breathing. Neurologic: No focal deficits.   Lab Results  Component Value Date   CREATININE 0.71 08/23/2022   BUN 7 08/23/2022   NA 137 08/23/2022  K 4.4 08/23/2022   CL 101 08/23/2022   CO2 20 08/23/2022   Lab Results  Component Value Date   ALT 18 08/23/2022   AST 15 08/23/2022   ALKPHOS 88 08/23/2022   BILITOT <0.2 08/23/2022   Lab Results  Component Value  Date   HGBA1C 5.4 08/23/2022   HGBA1C 5.5 03/22/2022   Lab Results  Component Value Date   INSULIN 24.8 08/23/2022   INSULIN 37.1 (H) 03/22/2022   Lab Results  Component Value Date   TSH 2.69 03/09/2022   Lab Results  Component Value Date   CHOL 195 08/23/2022   HDL 79 08/23/2022   LDLCALC 93 08/23/2022   TRIG 134 08/23/2022   Lab Results  Component Value Date   VD25OH 22.3 (L) 08/23/2022   VD25OH 27.3 (L) 03/22/2022   Lab Results  Component Value Date   WBC 13.7 03/09/2022   HGB 12.3 03/09/2022   HCT 39 03/09/2022   MCV 79 12/14/2020   PLT 439 (A) 03/09/2022    Attestation Statements:   Reviewed by clinician on day of visit: allergies, medications, problem list, medical history, surgical history, family history, social history, and previous encounter notes.  I, Kathlene November, BS, CMA, am acting as transcriptionist for [employer] Rayburn, PA-C.  I have reviewed the above documentation for accuracy and completeness, and I agree with the above. -  RAYBURN,SHAWN,PA-C

## 2022-11-07 ENCOUNTER — Encounter (INDEPENDENT_AMBULATORY_CARE_PROVIDER_SITE_OTHER): Payer: Self-pay | Admitting: Family Medicine

## 2022-11-07 ENCOUNTER — Ambulatory Visit (INDEPENDENT_AMBULATORY_CARE_PROVIDER_SITE_OTHER): Payer: Managed Care, Other (non HMO) | Admitting: Family Medicine

## 2022-11-07 VITALS — BP 136/79 | HR 96 | Temp 98.2°F | Ht 62.0 in | Wt 197.0 lb

## 2022-11-07 DIAGNOSIS — E559 Vitamin D deficiency, unspecified: Secondary | ICD-10-CM

## 2022-11-07 DIAGNOSIS — Z6836 Body mass index (BMI) 36.0-36.9, adult: Secondary | ICD-10-CM

## 2022-11-07 DIAGNOSIS — E669 Obesity, unspecified: Secondary | ICD-10-CM | POA: Diagnosis not present

## 2022-11-07 DIAGNOSIS — E88819 Insulin resistance, unspecified: Secondary | ICD-10-CM | POA: Diagnosis not present

## 2022-11-07 NOTE — Progress Notes (Deleted)
Patient reports she left school for the semester (took leave of absence).  She does feel better since being home from school.  She has been trying to eat more on plan.  Breakfast is 2 scrambled eggs and 2 pieces of toast, lunch is a lean cuisine or spicy korean noodles, dinner is chicken or spaghetti and other times it is a peanut butter and jelly sandwich.  Has been eating the real good chicken.  Has made some substitutions for higher protein snacks and foods. Mother has been trying to make substitutions for snacks.  No upcoming plans for travel or events.  She voices she may take a class over the summer.

## 2022-11-15 NOTE — Progress Notes (Signed)
Chief Complaint:   OBESITY [name] is here to discuss her progress with her obesity treatment plan along with follow-up of her obesity related diagnoses. [name] is on the Category 3 Plan and states she is following her eating plan approximately 70% of the time. [name] states she is weighted hula hoop 40 minutes 7 times per week.  Today's visit was #: 6 Starting weight: 216 LBS Starting date: 03/22/2022 Today's weight: 197 LBS Today's date: 11/07/2022 Total lbs lost to date: 19 LBS Total lbs lost since last in-office visit: 2 LBS  Interim History:  Patient reports she left school for the semester (took leave of absence).  She does feel better since being home from school.  She has been trying to eat more on plan.  Breakfast is 2 scrambled eggs and 2 pieces of toast, lunch is a lean cuisine or spicy korean noodles, dinner is chicken or spaghetti and other times it is a peanut butter and jelly sandwich.  Has been eating the real good chicken.  Has made some substitutions for higher protein snacks and foods. Mother has been trying to make substitutions for snacks.  No upcoming plans for travel or events.  She voices she may take a class over the summer.    Subjective:   1. Vitamin D deficiency Patient is on prescription Vitamin D.  Patient is positive for fatigue.   2. Insulin resistance Patients last insulin level elevated. Patient still has some carb cravings.   Assessment/Plan:   1. Vitamin D deficiency Continue Vitamin D, no change in treatment dose.   2. Insulin resistance Plan to repeat labs in May or June 2024.  3. Obesity, Current BMI 36.1 [name] is currently in the action stage of change. As such, her goal is to continue with weight loss efforts. She has agreed to the Category 3 Plan.   Exercise goals: All adults should avoid inactivity. Some physical activity is better than none, and adults who participate in any amount of physical activity gain some health  benefits.  Behavioral modification strategies: increasing lean protein intake, meal planning and cooking strategies, keeping healthy foods in the home, and planning for success.  [name] has agreed to follow-up with our clinic in 4-5 weeks. She was informed of the importance of frequent follow-up visits to maximize her success with intensive lifestyle modifications for her multiple health conditions.   Objective:   Blood pressure 136/79, pulse 96, temperature 98.2 F (36.8 C), height '5\' 2"'$  (1.575 m), weight 197 lb (89.4 kg), last menstrual period 11/05/2022, SpO2 97 %. Body mass index is 36.03 kg/m.  General: Cooperative, alert, well developed, in no acute distress. HEENT: Conjunctivae and lids unremarkable. Cardiovascular: Regular rhythm.  Lungs: Normal work of breathing. Neurologic: No focal deficits.   Lab Results  Component Value Date   CREATININE 0.71 08/23/2022   BUN 7 08/23/2022   NA 137 08/23/2022   K 4.4 08/23/2022   CL 101 08/23/2022   CO2 20 08/23/2022   Lab Results  Component Value Date   ALT 18 08/23/2022   AST 15 08/23/2022   ALKPHOS 88 08/23/2022   BILITOT <0.2 08/23/2022   Lab Results  Component Value Date   HGBA1C 5.4 08/23/2022   HGBA1C 5.5 03/22/2022   Lab Results  Component Value Date   INSULIN 24.8 08/23/2022   INSULIN 37.1 (H) 03/22/2022   Lab Results  Component Value Date   TSH 2.69 03/09/2022   Lab Results  Component Value Date  CHOL 195 08/23/2022   HDL 79 08/23/2022   LDLCALC 93 08/23/2022   TRIG 134 08/23/2022   Lab Results  Component Value Date   VD25OH 22.3 (L) 08/23/2022   VD25OH 27.3 (L) 03/22/2022   Lab Results  Component Value Date   WBC 13.7 03/09/2022   HGB 12.3 03/09/2022   HCT 39 03/09/2022   MCV 79 12/14/2020   PLT 439 (A) 03/09/2022   No results found for: "IRON", "TIBC", "FERRITIN"  Attestation Statements:   Reviewed by clinician on day of visit: allergies, medications, problem list, medical history,  surgical history, family history, social history, and previous encounter notes.  I, Davy Pique, RMA, am acting as transcriptionist for Coralie Common, MD. I have reviewed the above documentation for accuracy and completeness, and I agree with the above. - Coralie Common, MD

## 2022-12-12 ENCOUNTER — Ambulatory Visit (INDEPENDENT_AMBULATORY_CARE_PROVIDER_SITE_OTHER): Payer: Managed Care, Other (non HMO) | Admitting: Family Medicine

## 2023-01-01 ENCOUNTER — Ambulatory Visit (INDEPENDENT_AMBULATORY_CARE_PROVIDER_SITE_OTHER): Payer: Managed Care, Other (non HMO) | Admitting: Family Medicine

## 2023-01-01 ENCOUNTER — Encounter (INDEPENDENT_AMBULATORY_CARE_PROVIDER_SITE_OTHER): Payer: Self-pay | Admitting: Family Medicine

## 2023-01-01 VITALS — BP 111/75 | HR 98 | Temp 98.4°F | Ht 62.0 in | Wt 191.0 lb

## 2023-01-01 DIAGNOSIS — Z6834 Body mass index (BMI) 34.0-34.9, adult: Secondary | ICD-10-CM

## 2023-01-01 DIAGNOSIS — E559 Vitamin D deficiency, unspecified: Secondary | ICD-10-CM

## 2023-01-01 DIAGNOSIS — E669 Obesity, unspecified: Secondary | ICD-10-CM | POA: Diagnosis not present

## 2023-01-01 DIAGNOSIS — E88819 Insulin resistance, unspecified: Secondary | ICD-10-CM | POA: Diagnosis not present

## 2023-01-01 MED ORDER — VITAMIN D (ERGOCALCIFEROL) 1.25 MG (50000 UNIT) PO CAPS: 50000.0000 [IU] | ORAL_CAPSULE | ORAL | 0 refills | Status: DC

## 2023-01-01 NOTE — Progress Notes (Signed)
Chief Complaint:   OBESITY [name] is here to discuss her progress with her obesity treatment plan along with follow-up of her obesity related diagnoses. [name] is on the Category 3 Plan and states she is following her eating plan approximately 80% of the time. [name] states she is weighted hula hoop 30 minutes 6-7 times per week.  Today's visit was #: 7 Starting weight: 216 lbs Starting date: 03/22/2022 Today's weight: 197 lbs Today's date: 01/01/2023 Total lbs lost to date: 19 lbs Total lbs lost since last in-office visit: 6 lbs  Interim History: Patient was sick over her birthday.  She has been doing quite a bit of eggs, chicken, frozen meatballs and beef.  Not consistently hungry but doesn't think she is getting all the food in.  She is still struggling with her migraines and is going to go start Botox for her migraines in the next couple of weeks.  Days with migraines she often misses lunch and sometimes dinner.  No upcoming plans for the summer.   Subjective:   1. Vitamin D deficiency Patient is on prescription vitamin D.  Patient denies nausea, vomiting, muscle weakness but is positive for fatigue.  Patient last vitamin D level was 22.3.  2. Insulin resistance Patient last A1c was 5.4, insulin 24.8.  Patient is not taking any medications.  Assessment/Plan:   1. Vitamin D deficiency Refill- Vitamin D, Ergocalciferol, (DRISDOL) 1.25 MG (50000 UNIT) CAPS capsule; Take 1 capsule (50,000 Units total) by mouth every 7 (seven) days.  Dispense: 12 capsule; Refill: 0  2. Insulin resistance Follow-up labs at next appointment.  3. Obesity, Current BMI 35.1 [name] is currently in the action stage of change. As such, her goal is to continue with weight loss efforts. She has agreed to the Category 3 Plan.   Exercise goals: All adults should avoid inactivity. Some physical activity is better than none, and adults who participate in any amount of physical activity gain some health  benefits.  Behavioral modification strategies: increasing lean protein intake, no skipping meals, meal planning and cooking strategies, keeping healthy foods in the home, and planning for success.  [name] has agreed to follow-up with our clinic in 3 weeks. She was informed of the importance of frequent follow-up visits to maximize her success with intensive lifestyle modifications for her multiple health conditions.   Objective:   Blood pressure 111/75, pulse 98, temperature 98.4 F (36.9 C), height 5\' 2"  (1.575 m), weight 191 lb (86.6 kg), SpO2 96 %. Body mass index is 34.93 kg/m.  General: Cooperative, alert, well developed, in no acute distress. HEENT: Conjunctivae and lids unremarkable. Cardiovascular: Regular rhythm.  Lungs: Normal work of breathing. Neurologic: No focal deficits.   Lab Results  Component Value Date   CREATININE 0.71 08/23/2022   BUN 7 08/23/2022   NA 137 08/23/2022   K 4.4 08/23/2022   CL 101 08/23/2022   CO2 20 08/23/2022   Lab Results  Component Value Date   ALT 18 08/23/2022   AST 15 08/23/2022   ALKPHOS 88 08/23/2022   BILITOT <0.2 08/23/2022   Lab Results  Component Value Date   HGBA1C 5.4 08/23/2022   HGBA1C 5.5 03/22/2022   Lab Results  Component Value Date   INSULIN 24.8 08/23/2022   INSULIN 37.1 (H) 03/22/2022   Lab Results  Component Value Date   TSH 2.69 03/09/2022   Lab Results  Component Value Date   CHOL 195 08/23/2022   HDL 79 08/23/2022  LDLCALC 93 08/23/2022   TRIG 134 08/23/2022   Lab Results  Component Value Date   VD25OH 22.3 (L) 08/23/2022   VD25OH 27.3 (L) 03/22/2022   Lab Results  Component Value Date   WBC 13.7 03/09/2022   HGB 12.3 03/09/2022   HCT 39 03/09/2022   MCV 79 12/14/2020   PLT 439 (A) 03/09/2022   No results found for: "IRON", "TIBC", "FERRITIN"  Attestation Statements:   Reviewed by clinician on day of visit: allergies, medications, problem list, medical history, surgical history,  family history, social history, and previous encounter notes.  I, Malcolm Metro, am acting as Energy manager for Seymour Bars, DO.  I have reviewed the above documentation for accuracy and completeness, and I agree with the above. - Reuben Likes, MD

## 2023-01-22 ENCOUNTER — Encounter (INDEPENDENT_AMBULATORY_CARE_PROVIDER_SITE_OTHER): Payer: Self-pay | Admitting: Family Medicine

## 2023-01-22 ENCOUNTER — Ambulatory Visit (INDEPENDENT_AMBULATORY_CARE_PROVIDER_SITE_OTHER): Payer: Managed Care, Other (non HMO) | Admitting: Family Medicine

## 2023-01-22 VITALS — BP 115/77 | Temp 98.0°F | Ht 62.0 in | Wt 195.0 lb

## 2023-01-22 DIAGNOSIS — E669 Obesity, unspecified: Secondary | ICD-10-CM | POA: Diagnosis not present

## 2023-01-22 DIAGNOSIS — E88819 Insulin resistance, unspecified: Secondary | ICD-10-CM | POA: Diagnosis not present

## 2023-01-22 DIAGNOSIS — E559 Vitamin D deficiency, unspecified: Secondary | ICD-10-CM

## 2023-01-22 DIAGNOSIS — Z6835 Body mass index (BMI) 35.0-35.9, adult: Secondary | ICD-10-CM | POA: Diagnosis not present

## 2023-01-22 DIAGNOSIS — Z6839 Body mass index (BMI) 39.0-39.9, adult: Secondary | ICD-10-CM

## 2023-01-22 MED ORDER — VITAMIN D (ERGOCALCIFEROL) 1.25 MG (50000 UNIT) PO CAPS: 50000.0000 [IU] | ORAL_CAPSULE | ORAL | 0 refills | Status: DC

## 2023-01-22 NOTE — Progress Notes (Signed)
Chief Complaint:   OBESITY [name] is here to discuss her progress with her obesity treatment plan along with follow-up of her obesity related diagnoses. [name] is on the Category 3 Plan and states she is following her eating plan approximately 75% of the time. [name] states she is using weight hula hoop for 45 minutes 7 times per week.  Today's visit was #: 8 Starting weight: 216 lbs Starting date: 03/22/2022 Today's weight: 195 lbs Today's date: 01/22/2023 Total lbs lost to date: 21 Total lbs lost since last in-office visit: 0  Interim History: Patient has been mostly living life the same since the last appointment.  She has had more headaches recently particularly since her first round of Botox injections.  She has been sleeping more and doesn't necessarily thinks that really impacted her food. Next round of Botox is in July or August.  She is getting in 2-3 eggs and 4-5 pieces of chicken; may also get in some toast.   Subjective:   1. Vitamin D deficiency [name] is on prescription Vitamin D. She denies nausea, vomiting, or muscle weakness but notes fatigue.   2. Insulin resistance [name] is not on medications. Her last insulin level was 24.8 (improved from 37.1). She notes minimal carbohydrate intake.   Assessment/Plan:   1. Vitamin D deficiency [name] will continue prescription Vitamin D 50,000 IU once weekly, no refill needed.   2. Insulin resistance We will recheck labs in July.   3. BMI 35.0-35.9,adult  4. Obesity with starting BMI of 39.5 [name] is currently in the action stage of change. As such, her goal is to continue with weight loss efforts. She has agreed to the Category 3 Plan and practicing portion control and making smarter food choices, such as increasing vegetables and decreasing simple carbohydrates.   Exercise goals: As is.   Behavioral modification strategies: increasing lean protein intake, meal planning and cooking strategies, keeping healthy  foods in the home, and planning for success.  [name] has agreed to follow-up with our clinic in 3 weeks. She was informed of the importance of frequent follow-up visits to maximize her success with intensive lifestyle modifications for her multiple health conditions.   Objective:   Blood pressure 115/77, temperature 98 F (36.7 C), height 5\' 2"  (1.575 m), weight 195 lb (88.5 kg). Body mass index is 35.67 kg/m.  General: Cooperative, alert, well developed, in no acute distress. HEENT: Conjunctivae and lids unremarkable. Cardiovascular: Regular rhythm.  Lungs: Normal work of breathing. Neurologic: No focal deficits.   Lab Results  Component Value Date   CREATININE 0.71 08/23/2022   BUN 7 08/23/2022   NA 137 08/23/2022   K 4.4 08/23/2022   CL 101 08/23/2022   CO2 20 08/23/2022   Lab Results  Component Value Date   ALT 18 08/23/2022   AST 15 08/23/2022   ALKPHOS 88 08/23/2022   BILITOT <0.2 08/23/2022   Lab Results  Component Value Date   HGBA1C 5.4 08/23/2022   HGBA1C 5.5 03/22/2022   Lab Results  Component Value Date   INSULIN 24.8 08/23/2022   INSULIN 37.1 (H) 03/22/2022   Lab Results  Component Value Date   TSH 2.69 03/09/2022   Lab Results  Component Value Date   CHOL 195 08/23/2022   HDL 79 08/23/2022   LDLCALC 93 08/23/2022   TRIG 134 08/23/2022   Lab Results  Component Value Date   VD25OH 22.3 (L) 08/23/2022   VD25OH 27.3 (L) 03/22/2022   Lab Results  Component Value Date   WBC 13.7 03/09/2022   HGB 12.3 03/09/2022   HCT 39 03/09/2022   MCV 79 12/14/2020   PLT 439 (A) 03/09/2022   No results found for: "IRON", "TIBC", "FERRITIN"  Attestation Statements:   Reviewed by clinician on day of visit: allergies, medications, problem list, medical history, surgical history, family history, social history, and previous encounter notes.   I, Burt Knack, am acting as transcriptionist for Reuben Likes, MD.  I have reviewed the above  documentation for accuracy and completeness, and I agree with the above. - Reuben Likes, MD

## 2023-02-12 ENCOUNTER — Ambulatory Visit (INDEPENDENT_AMBULATORY_CARE_PROVIDER_SITE_OTHER): Payer: Managed Care, Other (non HMO) | Admitting: Family Medicine

## 2023-02-12 ENCOUNTER — Encounter (INDEPENDENT_AMBULATORY_CARE_PROVIDER_SITE_OTHER): Payer: Self-pay | Admitting: Family Medicine

## 2023-02-12 VITALS — BP 111/75 | HR 102 | Temp 98.2°F | Ht 62.0 in | Wt 189.0 lb

## 2023-02-12 DIAGNOSIS — E669 Obesity, unspecified: Secondary | ICD-10-CM

## 2023-02-12 DIAGNOSIS — E88819 Insulin resistance, unspecified: Secondary | ICD-10-CM

## 2023-02-12 DIAGNOSIS — E559 Vitamin D deficiency, unspecified: Secondary | ICD-10-CM

## 2023-02-12 DIAGNOSIS — Z6834 Body mass index (BMI) 34.0-34.9, adult: Secondary | ICD-10-CM | POA: Diagnosis not present

## 2023-02-12 NOTE — Progress Notes (Signed)
Chief Complaint:   OBESITY [name] is here to discuss her progress with her obesity treatment plan along with follow-up of her obesity related diagnoses. [name] is on the Category 3 Plan and practicing portion control and making smarter food choices, such as increasing vegetables and decreasing simple carbohydrates and states she is following her eating plan approximately 80% of the time. [name] states she is exercising for 30 minutes 3 times per week.  Today's visit was #: 9 Starting weight: 216 lbs Starting date: 03/22/2022 Today's weight: 189 lbs Today's date: 02/12/2023 Total lbs lost to date: 27 Total lbs lost since last in-office visit: 6  Interim History: Patient has been trying to be more mindful of food choices over the last month.  She has had really significant migraines even with her botox and ajovy.  She is experiencing migraines daily. She is getting in quite a bit of fruit (strawberries because they are in season).  Eating more protein like eggs. She is able to get in some snacks that are higher in protein like quest chips or bars.  She doesn't like a lot of animal protein or seafood.  She is wondering about what other options she may have.   Subjective:   1. Vitamin D deficiency Patient is on prescription vitamin D.  She denies nausea, vomiting, or muscle weakness but notes fatigue.  2. Insulin resistance Patient is not on medications.  She is on birth control currently.  Assessment/Plan:   1. Vitamin D deficiency Patient will continue prescription vitamin D 50,000 IU once weekly.  2. Insulin resistance Patient will continue her birth control; no change to meal plan.  3. BMI 34.0-34.9,adult  4. Obesity with starting BMI of 39.5 [name] is currently in the action stage of change. As such, her goal is to continue with weight loss efforts. She has agreed to keeping a food journal and adhering to recommended goals of 1400-1500 calories and 90+ grams of protein  daily.   We will repeat fasting labs at her next appointment.   Exercise goals: All adults should avoid inactivity. Some physical activity is better than none, and adults who participate in any amount of physical activity gain some health benefits.  Behavioral modification strategies: increasing lean protein intake, meal planning and cooking strategies, keeping healthy foods in the home, and planning for success.  [name] has agreed to follow-up with our clinic in 4 weeks. She was informed of the importance of frequent follow-up visits to maximize her success with intensive lifestyle modifications for her multiple health conditions.   Objective:   Blood pressure 111/75, pulse (!) 102, temperature 98.2 F (36.8 C), height 5\' 2"  (1.575 m), weight 189 lb (85.7 kg), SpO2 95 %. Body mass index is 34.57 kg/m.  General: Cooperative, alert, well developed, in no acute distress. HEENT: Conjunctivae and lids unremarkable. Cardiovascular: Regular rhythm.  Lungs: Normal work of breathing. Neurologic: No focal deficits.   Lab Results  Component Value Date   CREATININE 0.71 08/23/2022   BUN 7 08/23/2022   NA 137 08/23/2022   K 4.4 08/23/2022   CL 101 08/23/2022   CO2 20 08/23/2022   Lab Results  Component Value Date   ALT 18 08/23/2022   AST 15 08/23/2022   ALKPHOS 88 08/23/2022   BILITOT <0.2 08/23/2022   Lab Results  Component Value Date   HGBA1C 5.4 08/23/2022   HGBA1C 5.5 03/22/2022   Lab Results  Component Value Date   INSULIN 24.8 08/23/2022  INSULIN 37.1 (H) 03/22/2022   Lab Results  Component Value Date   TSH 2.69 03/09/2022   Lab Results  Component Value Date   CHOL 195 08/23/2022   HDL 79 08/23/2022   LDLCALC 93 08/23/2022   TRIG 134 08/23/2022   Lab Results  Component Value Date   VD25OH 22.3 (L) 08/23/2022   VD25OH 27.3 (L) 03/22/2022   Lab Results  Component Value Date   WBC 13.7 03/09/2022   HGB 12.3 03/09/2022   HCT 39 03/09/2022   MCV 79  12/14/2020   PLT 439 (A) 03/09/2022   No results found for: "IRON", "TIBC", "FERRITIN"  Attestation Statements:   Reviewed by clinician on day of visit: allergies, medications, problem list, medical history, surgical history, family history, social history, and previous encounter notes.   I, Burt Knack, am acting as transcriptionist for Reuben Likes, MD.  I have reviewed the above documentation for accuracy and completeness, and I agree with the above. - Reuben Likes, MD

## 2023-03-19 ENCOUNTER — Ambulatory Visit (INDEPENDENT_AMBULATORY_CARE_PROVIDER_SITE_OTHER): Payer: Managed Care, Other (non HMO) | Admitting: Family Medicine

## 2023-03-19 ENCOUNTER — Encounter (INDEPENDENT_AMBULATORY_CARE_PROVIDER_SITE_OTHER): Payer: Self-pay | Admitting: Family Medicine

## 2023-03-19 VITALS — BP 113/75 | HR 91 | Temp 97.8°F | Ht 62.0 in | Wt 187.0 lb

## 2023-03-19 DIAGNOSIS — E7849 Other hyperlipidemia: Secondary | ICD-10-CM | POA: Diagnosis not present

## 2023-03-19 DIAGNOSIS — E88819 Insulin resistance, unspecified: Secondary | ICD-10-CM | POA: Diagnosis not present

## 2023-03-19 DIAGNOSIS — E669 Obesity, unspecified: Secondary | ICD-10-CM | POA: Diagnosis not present

## 2023-03-19 DIAGNOSIS — E559 Vitamin D deficiency, unspecified: Secondary | ICD-10-CM | POA: Diagnosis not present

## 2023-03-19 DIAGNOSIS — Z6834 Body mass index (BMI) 34.0-34.9, adult: Secondary | ICD-10-CM

## 2023-03-19 DIAGNOSIS — Z6839 Body mass index (BMI) 39.0-39.9, adult: Secondary | ICD-10-CM

## 2023-03-19 NOTE — Progress Notes (Signed)
Chief Complaint:   OBESITY [name] is here to discuss her progress with her obesity treatment plan along with follow-up of her obesity related diagnoses. [name] is on keeping a food journal and adhering to recommended goals of 1400-1500 calories and 90+ grams of protein and states she is following her eating plan approximately 85% of the time. [name] states she is doing a little walking.    Today's visit was #: 10 Starting weight: 216 lbs Starting date: 03/22/2022 Today's weight: 187 lbs Today's date: 03/19/2023 Total lbs lost to date: 29 Total lbs lost since last in-office visit: 2  Interim History: Patient has been trying to focus on eating more since last appointment.  She is still experiencing significant headaches and her next round of botox is in a month. She is eating yogurt, eggs, Carb A Nada pasta.  She is logging on My [employer]itness Pal daily. Normally she has 300-400 calories left for the day. Upon review of data she is only averaging 600-800 calories daily and getting about 40g of protein a day.   Subjective:   1. Vitamin D deficiency Patient is on prescription vitamin D, and she notes fatigue.  2. Insulin resistance Patient still notes some carbohydrate cravings.  Minimal total calorie per day intake.  3. Other hyperlipidemia Patient's last LDL was >100 and HDL 78.  She is not on medications.  Assessment/Plan:   1. Vitamin D deficiency We will check labs today, and we will follow-up at patient's next appointment.  - VITAMIN D 25 Hydroxy (Vit-D Deficiency, Fractures)  2. Insulin resistance We will check labs today, and we will follow-up at patient's next appointment.  - Comprehensive metabolic panel - Hemoglobin A1c - Insulin, random  3. Other hyperlipidemia We will check labs today, and we will follow-up at patient's next appointment.  - Lipid Panel With LDL/HDL Ratio  4. BMI 34.0-34.9,adult  5. Obesity with starting BMI of 39.5 [name] is currently in  the action stage of change. As such, her goal is to continue with weight loss efforts. She has agreed to keeping a food journal and adhering to recommended goals of 1400-1500 calories and 90+ grams of protein daily.   Exercise goals: All adults should avoid inactivity. Some physical activity is better than none, and adults who participate in any amount of physical activity gain some health benefits.  Behavioral modification strategies: increasing lean protein intake, meal planning and cooking strategies, keeping healthy foods in the home, planning for success, and keeping a strict food journal.  [name] has agreed to follow-up with our clinic in 3 to 4 weeks. She was informed of the importance of frequent follow-up visits to maximize her success with intensive lifestyle modifications for her multiple health conditions.   [name] was informed we would discuss her lab results at her next visit unless there is a critical issue that needs to be addressed sooner. [name] agreed to keep her next visit at the agreed upon time to discuss these results.  Objective:   Blood pressure 113/75, pulse 91, temperature 97.8 F (36.6 C), height 5\' 2"  (1.575 m), weight 187 lb (84.8 kg), last menstrual period 03/12/2023, SpO2 96%. Body mass index is 34.2 kg/m.  General: Cooperative, alert, well developed, in no acute distress. HEENT: Conjunctivae and lids unremarkable. Cardiovascular: Regular rhythm.  Lungs: Normal work of breathing. Neurologic: No focal deficits.   Lab Results  Component Value Date   CREATININE 0.63 03/19/2023   BUN 6 03/19/2023   NA 142 03/19/2023  K 4.1 03/19/2023   CL 102 03/19/2023   CO2 21 03/19/2023   Lab Results  Component Value Date   ALT 26 03/19/2023   AST 19 03/19/2023   ALKPHOS 79 03/19/2023   BILITOT 0.2 03/19/2023   Lab Results  Component Value Date   HGBA1C 5.3 03/19/2023   HGBA1C 5.4 08/23/2022   HGBA1C 5.5 03/22/2022   Lab Results  Component Value Date    INSULIN 26.1 (H) 03/19/2023   INSULIN 24.8 08/23/2022   INSULIN 37.1 (H) 03/22/2022   Lab Results  Component Value Date   TSH 2.69 03/09/2022   Lab Results  Component Value Date   CHOL 181 03/19/2023   HDL 60 03/19/2023   LDLCALC 95 03/19/2023   TRIG 151 (H) 03/19/2023   Lab Results  Component Value Date   VD25OH 33.6 03/19/2023   VD25OH 22.3 (L) 08/23/2022   VD25OH 27.3 (L) 03/22/2022   Lab Results  Component Value Date   WBC 13.7 03/09/2022   HGB 12.3 03/09/2022   HCT 39 03/09/2022   MCV 79 12/14/2020   PLT 439 (A) 03/09/2022   No results found for: "IRON", "TIBC", "FERRITIN"  Attestation Statements:   Reviewed by clinician on day of visit: allergies, medications, problem list, medical history, surgical history, family history, social history, and previous encounter notes.   I, Burt Knack, am acting as transcriptionist for Reuben Likes, MD.  I have reviewed the above documentation for accuracy and completeness, and I agree with the above. - Reuben Likes, MD

## 2023-03-20 LAB — HEMOGLOBIN A1C
Est. average glucose Bld gHb Est-mCnc: 105 mg/dL
Hgb A1c MFr Bld: 5.3 % (ref 4.8–5.6)

## 2023-03-20 LAB — COMPREHENSIVE METABOLIC PANEL
ALT: 26 IU/L (ref 0–32)
AST: 19 IU/L (ref 0–40)
Albumin: 4.1 g/dL (ref 4.0–5.0)
Alkaline Phosphatase: 79 IU/L (ref 44–121)
BUN/Creatinine Ratio: 10 (ref 9–23)
BUN: 6 mg/dL (ref 6–20)
Bilirubin Total: 0.2 mg/dL (ref 0.0–1.2)
CO2: 21 mmol/L (ref 20–29)
Calcium: 9.3 mg/dL (ref 8.7–10.2)
Chloride: 102 mmol/L (ref 96–106)
Creatinine, Ser: 0.63 mg/dL (ref 0.57–1.00)
Globulin, Total: 2.8 g/dL (ref 1.5–4.5)
Glucose: 80 mg/dL (ref 70–99)
Potassium: 4.1 mmol/L (ref 3.5–5.2)
Sodium: 142 mmol/L (ref 134–144)
Total Protein: 6.9 g/dL (ref 6.0–8.5)
eGFR: 129 mL/min/{1.73_m2} (ref >59)

## 2023-03-20 LAB — VITAMIN D 25 HYDROXY (VIT D DEFICIENCY, FRACTURES): Vit D, 25-Hydroxy: 33.6 ng/mL (ref 30.0–100.0)

## 2023-03-20 LAB — LIPID PANEL WITH LDL/HDL RATIO
Cholesterol, Total: 181 mg/dL (ref 100–199)
HDL: 60 mg/dL (ref >39)
LDL Chol Calc (NIH): 95 mg/dL (ref 0–99)
LDL/HDL Ratio: 1.6 ratio (ref 0.0–3.2)
Triglycerides: 151 mg/dL — ABNORMAL HIGH (ref 0–149)
VLDL Cholesterol Cal: 26 mg/dL (ref 5–40)

## 2023-03-20 LAB — INSULIN, RANDOM: INSULIN: 26.1 u[IU]/mL — ABNORMAL HIGH (ref 2.6–24.9)

## 2023-04-04 ENCOUNTER — Ambulatory Visit
Admission: EM | Admit: 2023-04-04 | Discharge: 2023-04-04 | Disposition: A | Discharge status: Home / Self Care | Payer: Managed Care, Other (non HMO) | Attending: Family Medicine | Admitting: Family Medicine

## 2023-04-04 ENCOUNTER — Encounter: Payer: Self-pay | Admitting: Emergency Medicine

## 2023-04-04 ENCOUNTER — Ambulatory Visit: Payer: Managed Care, Other (non HMO)

## 2023-04-04 DIAGNOSIS — R1084 Generalized abdominal pain: Secondary | ICD-10-CM

## 2023-04-04 DIAGNOSIS — R1033 Periumbilical pain: Secondary | ICD-10-CM

## 2023-04-04 NOTE — ED Provider Notes (Signed)
[name]    CSN: [id number] Arrival date & time: 04/04/23  1819      History   Chief Complaint Chief Complaint  Patient presents with   Abdominal Pain    HPI [name] is a 22 y.o. female.   HPI Pleasant 22 year old female presents with pain around her bellybutton for 3 days.  PMH significant for obesity, IBS, and T2DM.  Patient is accompanied by her father this evening.  Past Medical History:  Diagnosis Date   Acquired primary ovarian hypogonadism    ADD (attention deficit disorder)    Amenorrhea    when cycles first started age 62   Anxiety    Depression    Diabetes mellitus (HCC) 06/21/2022   Elevated prolactin level 09/16/2018   Taking Pamelor.   Gallbladder problem    Headache(784.0)    migraine without aura   IBS (irritable bowel syndrome)    PCOS (polycystic ovarian syndrome)    PCOS (polycystic ovarian syndrome)     Patient Active Problem List   Diagnosis Date Noted   Diabetes mellitus (HCC) 06/21/2022   Vitamin D deficiency 06/21/2022   Other hyperlipidemia 06/21/2022   Class 2 severe obesity with serious comorbidity and body mass index (BMI) of 39.0 to 39.9 in adult (HCC) 06/21/2022   Insulin resistance 06/21/2022   Side pain 06/21/2022   Chronic daily headache 01/12/2021   Anxiety 06/04/2020   Depressive disorder 06/04/2020   Abdominal pain 07/17/2018   NAFLD (nonalcoholic fatty liver disease) 52/84/1324   S/P nail surgery 01/27/2016   Ingrown toenail 01/20/2016   PCOS (polycystic ovarian syndrome) 10/08/2014   Tension headache 01/20/2013   Migraine without aura and without status migrainosus, not intractable 01/20/2013   ADD (attention deficit disorder) 01/27/2012    Past Surgical History:  Procedure Laterality Date   CHOLECYSTECTOMY     CHOLECYSTECTOMY  2018    OB History     Gravida  0   Para  0   Term  0   Preterm  0   AB  0   Living  0      SAB  0   IAB  0   Ectopic  0   Multiple  0   Live  Births  0            Home Medications    Prior to Admission medications   Medication Sig Start Date End Date Taking? Authorizing Provider  Acetaminophen (TYLENOL PO) Take 1 tablet by mouth daily as needed (pain). Fast acting   Yes [provider]  desvenlafaxine (PRISTIQ) 50 MG 24 hr tablet Take 50 mg by mouth daily.   Yes [provider]  drospirenone-ethinyl estradiol (YASMIN) 3-0.03 MG tablet Take 1 tablet by mouth daily. 06/22/22  Yes Amundson Shirley Friar, MD  Fremanezumab-vfrm (AJOVY) 225 MG/1.5ML SOAJ Inject into the skin.   Yes [provider]  ibuprofen (ADVIL) 200 MG tablet Take 200 mg by mouth every 6 (six) hours as needed.   Yes [provider]  sodium fluoride (FLUORISHIELD) 1.1 % GEL dental gel PreviDent 5000 Dry Mouth 1.1 % dental paste   Yes [provider]  Vitamin D, Ergocalciferol, (DRISDOL) 1.25 MG (50000 UNIT) CAPS capsule Take 1 capsule (50,000 Units total) by mouth every 7 (seven) days. 01/22/23  Yes Langston Reusing, MD  gabapentin (NEURONTIN) 300 MG capsule Take 300 mg by mouth 2 (two) times daily.    [provider]  hyoscyamine (LEVSIN SL)  0.125 MG SL tablet Place under the tongue every 4 (four) hours as needed. 10/21/20   [provider]    Family History Family History  Adopted: Yes    Social History Social History   Tobacco Use   Smoking status: Never   Smokeless tobacco: Never  Vaping Use   Vaping status: Former  Substance Use Topics   Alcohol use: No   Drug use: No     Allergies   Latex   Review of Systems Review of Systems  Gastrointestinal:  Positive for abdominal pain.  All other systems reviewed and are negative.    Physical Exam Triage Vital Signs ED Triage Vitals  Encounter Vitals Group     BP 04/04/23 1831 118/77     Systolic BP Percentile --      Diastolic BP Percentile --      Pulse Rate 04/04/23 1831 94     Resp 04/04/23 1831 18     Temp 04/04/23  1831 98.4 F (36.9 C)     Temp Source 04/04/23 1831 Oral     SpO2 04/04/23 1831 94 %     Weight 04/04/23 1834 187 lb (84.8 kg)     Height 04/04/23 1834 5\' 3"  (1.6 m)     Head Circumference --      Peak Flow --      Pain Score 04/04/23 1833 3     Pain Loc --      Pain Education --      Exclude from Growth Chart --    No data found.  Updated Vital Signs BP 118/77 (BP Location: Left Arm)   Pulse 94   Temp 98.4 F (36.9 C) (Oral)   Resp 18   Ht 5\' 3"  (1.6 m)   Wt 187 lb (84.8 kg)   LMP 03/12/2023   SpO2 94%   BMI 33.13 kg/m    Physical Exam Vitals and nursing note reviewed.  Constitutional:      Appearance: She is well-developed. She is obese.  HENT:     Head: Normocephalic and atraumatic.     Mouth/Throat:     Mouth: Mucous membranes are moist.     Pharynx: Oropharynx is clear.  Eyes:     Extraocular Movements: Extraocular movements intact.     Pupils: Pupils are equal, round, and reactive to light.  Cardiovascular:     Rate and Rhythm: Normal rate and regular rhythm.     Heart sounds: Normal heart sounds. No murmur heard. Pulmonary:     Effort: Pulmonary effort is normal.     Breath sounds: Normal breath sounds.  Abdominal:     General: Abdomen is flat. Bowel sounds are normal. There is no distension.     Palpations: Abdomen is soft. There is no shifting dullness, fluid wave, hepatomegaly, splenomegaly, mass or pulsatile mass.     Tenderness: There is generalized abdominal tenderness. There is no right CVA tenderness, left CVA tenderness, guarding or rebound. Negative signs include Murphy's sign, Rovsing's sign, McBurney's sign, psoas sign and obturator sign.     Hernia: No hernia is present.  Skin:    General: Skin is warm and dry.  Neurological:     General: No focal deficit present.     Mental Status: She is alert and oriented to person, place, and time.  Psychiatric:        Mood and Affect: Mood normal.        Behavior: Behavior normal.      UC  Treatments / Results  Labs (all labs ordered are listed, but only abnormal results are displayed) Labs Reviewed - No data to display  EKG   Radiology DG Abdomen 1 View  Result Date: 04/04/2023 CLINICAL DATA:  Abdominal pain for 3 days. EXAM: ABDOMEN - 1 VIEW COMPARISON:  CT 05/24/2022 FINDINGS: Normal bowel gas pattern. Small volume of formed stool in the colon. Normal normal rectal distension. No radiopaque calculi or abnormal soft tissue calcifications. A right pelvic phleboliths. No concerning intraabdominal mass effect. No acute osseous abnormalities are seen IMPRESSION: Negative radiograph of the abdomen.  No explanation for pain. Electronically Signed   By: Narda Rutherford M.D.   On: 04/04/2023 18:56    Procedures Procedures (including critical care time)  Medications Ordered in UC Medications - No data to display  Initial Impression / Assessment and Plan / UC Course  I have reviewed the triage vital signs and the nursing notes.  Pertinent labs & imaging results that were available during my care of the patient were reviewed by me and considered in my medical decision making (see chart for details).     MDM: 1.  Periumbilical abdominal pain-KUB revealed above. Advised Father/patient of KUB x-ray results with hardcopy provided.  Advised Father/patient if abdominal pain worsens and/or unresolved please follow-up with PCP, local ED or here for further evaluation.  Patient discharged home, hemodynamically stable.  Final Clinical Impressions(s) / UC Diagnoses   Final diagnoses:  Periumbilical abdominal pain     Discharge Instructions      Advised Father/patient of KUB x-ray results with hardcopy provided.  Advised Father/patient if abdominal pain worsens and/or unresolved please follow-up with PCP, local ED or here for further evaluation.     ED Prescriptions   None    PDMP not reviewed this encounter.   Trevor Iha, FNP 04/04/23 [facility] [place]

## 2023-04-04 NOTE — ED Triage Notes (Signed)
Patient c/o pain around her belly button x 3 days.  No apparent injury.  Denies any nausea, vomiting, diarrhea or urinary sx's.  Patient has taken Tylenol and Advil.

## 2023-04-04 NOTE — Discharge Instructions (Addendum)
Advised Father/patient of KUB x-ray results with hardcopy provided.  Advised Father/patient if abdominal pain worsens and/or unresolved please follow-up with PCP, local ED or here for further evaluation.

## 2023-04-11 ENCOUNTER — Encounter (HOSPITAL_BASED_OUTPATIENT_CLINIC_OR_DEPARTMENT_OTHER): Payer: Managed Care, Other (non HMO) | Admitting: Physician Assistant

## 2023-04-16 ENCOUNTER — Ambulatory Visit (INDEPENDENT_AMBULATORY_CARE_PROVIDER_SITE_OTHER): Payer: Managed Care, Other (non HMO) | Admitting: Family Medicine

## 2023-04-20 ENCOUNTER — Other Ambulatory Visit: Payer: Self-pay | Admitting: Nurse Practitioner

## 2023-04-20 DIAGNOSIS — R1012 Left upper quadrant pain: Secondary | ICD-10-CM

## 2023-05-02 ENCOUNTER — Encounter (INDEPENDENT_AMBULATORY_CARE_PROVIDER_SITE_OTHER): Payer: Self-pay | Admitting: Family Medicine

## 2023-05-02 ENCOUNTER — Ambulatory Visit (INDEPENDENT_AMBULATORY_CARE_PROVIDER_SITE_OTHER): Payer: Managed Care, Other (non HMO) | Admitting: Family Medicine

## 2023-05-02 VITALS — BP 109/71 | HR 97 | Temp 98.3°F | Ht 62.0 in | Wt 192.0 lb

## 2023-05-02 DIAGNOSIS — Z6835 Body mass index (BMI) 35.0-35.9, adult: Secondary | ICD-10-CM | POA: Diagnosis not present

## 2023-05-02 DIAGNOSIS — E669 Obesity, unspecified: Secondary | ICD-10-CM | POA: Diagnosis not present

## 2023-05-02 DIAGNOSIS — E559 Vitamin D deficiency, unspecified: Secondary | ICD-10-CM

## 2023-05-02 DIAGNOSIS — E88819 Insulin resistance, unspecified: Secondary | ICD-10-CM

## 2023-05-02 MED ORDER — VITAMIN D (ERGOCALCIFEROL) 1.25 MG (50000 UNIT) PO CAPS: 50000.0000 [IU] | ORAL_CAPSULE | ORAL | 0 refills | Status: DC

## 2023-05-02 NOTE — Progress Notes (Signed)
Chief Complaint:   OBESITY [name] is here to discuss her progress with her obesity treatment plan along with follow-up of her obesity related diagnoses. [name] is on keeping a food journal and adhering to recommended goals of 1400-1500 calories and 90+ grams of protein and states she is following her eating plan approximately 50% of the time. [name] states she is doing 0 minutes 0 times per week.  Today's visit was #: 11 Starting weight: 216 lbs Starting date: 03/22/2022 Today's weight: 192 lbs Today's date: 05/02/2023 Total lbs lost to date: 24 Total lbs lost since last in-office visit: 0  Interim History: Patient voices that the rest of the summer she has done more resting and some crafting.  She is starting classes next week and she got her last set of Botox on the 12th of the this month.  She has numerous gastroenterology tests in the office coming up- hydrogen breath test, solid gastric emptying study ordered.  Foodwise she has been recording food daily. Has averaged between 1000-1250 calories and getting anywhere between 40-75g of protein daily pending on the calorie intake.  No plans for the Labor Day Weekend. Feels logging will be the easiest to continue on with moving forward.   Subjective:   1. Vitamin D deficiency Patient is on prescription vitamin D.  She denies nausea, vomiting, or muscle weakness but notes fatigue.  Her last vitamin D level was of 33.6.  I discussed labs with the patient today.  2. Insulin resistance Patient's last A1c was 5.3 and insulin 26.1 (improved from 5.4, insulin 24.8).  She is not on medications.  Her mom asked numerous questions today about Zepbound.  I discussed labs with the patient today.  Assessment/Plan:   1. Vitamin D deficiency We will refill prescription vitamin D 50,000 IU once weekly for 90 days.  - Vitamin D, Ergocalciferol, (DRISDOL) 1.25 MG (50000 UNIT) CAPS capsule; Take 1 capsule (50,000 Units total) by mouth every 7 (seven)  days.  Dispense: 12 capsule; Refill: 0  2. Insulin resistance We will repeat labs in 3 months.  Patient is to continue logging her food and aiming to hit goals.  3. BMI 35.0-35.9,adult  4. Obesity with starting BMI of 39.5 [name] is currently in the action stage of change. As such, her goal is to continue with weight loss efforts. She has agreed to keeping a food journal and adhering to recommended goals of 1400-1500 calories and 90+ grams of protein daily.   Exercise goals: No exercise has been prescribed at this time.  Behavioral modification strategies: increasing lean protein intake, meal planning and cooking strategies, keeping healthy foods in the home, and planning for success.  [name] has agreed to follow-up with our clinic in 4 to 5 weeks. She was informed of the importance of frequent follow-up visits to maximize her success with intensive lifestyle modifications for her multiple health conditions.   Objective:   Blood pressure 109/71, pulse 97, temperature 98.3 F (36.8 C), height 5\' 2"  (1.575 m), weight 192 lb (87.1 kg), last menstrual period 04/18/2023, SpO2 99%. Body mass index is 35.12 kg/m.  General: Cooperative, alert, well developed, in no acute distress. HEENT: Conjunctivae and lids unremarkable. Cardiovascular: Regular rhythm.  Lungs: Normal work of breathing. Neurologic: No focal deficits.   Lab Results  Component Value Date   CREATININE 0.63 03/19/2023   BUN 6 03/19/2023   NA 142 03/19/2023   K 4.1 03/19/2023   CL 102 03/19/2023   CO2 21 03/19/2023  Lab Results  Component Value Date   ALT 26 03/19/2023   AST 19 03/19/2023   ALKPHOS 79 03/19/2023   BILITOT 0.2 03/19/2023   Lab Results  Component Value Date   HGBA1C 5.3 03/19/2023   HGBA1C 5.4 08/23/2022   HGBA1C 5.5 03/22/2022   Lab Results  Component Value Date   INSULIN 26.1 (H) 03/19/2023   INSULIN 24.8 08/23/2022   INSULIN 37.1 (H) 03/22/2022   Lab Results  Component Value Date    TSH 2.69 03/09/2022   Lab Results  Component Value Date   CHOL 181 03/19/2023   HDL 60 03/19/2023   LDLCALC 95 03/19/2023   TRIG 151 (H) 03/19/2023   Lab Results  Component Value Date   VD25OH 33.6 03/19/2023   VD25OH 22.3 (L) 08/23/2022   VD25OH 27.3 (L) 03/22/2022   Lab Results  Component Value Date   WBC 13.7 03/09/2022   HGB 12.3 03/09/2022   HCT 39 03/09/2022   MCV 79 12/14/2020   PLT 439 (A) 03/09/2022   No results found for: "IRON", "TIBC", "FERRITIN"  Attestation Statements:   Reviewed by clinician on day of visit: allergies, medications, problem list, medical history, surgical history, family history, social history, and previous encounter notes.   I, Burt Knack, am acting as transcriptionist for Reuben Likes, MD.  I have reviewed the above documentation for accuracy and completeness, and I agree with the above. - Reuben Likes, MD

## 2023-05-04 ENCOUNTER — Encounter
Admission: RE | Admit: 2023-05-04 | Discharge: 2023-05-04 | Disposition: A | Discharge status: Home / Self Care | Payer: Managed Care, Other (non HMO) | Source: Ambulatory Visit | Attending: Nurse Practitioner | Admitting: Nurse Practitioner

## 2023-05-04 DIAGNOSIS — R1012 Left upper quadrant pain: Secondary | ICD-10-CM | POA: Insufficient documentation

## 2023-05-04 MED ORDER — TECHNETIUM TC 99M SULFUR COLLOID
2.0000 | Freq: Once | INTRAVENOUS | Status: AC | PRN
  Administered 2023-05-04: 2.08 via ORAL

## 2023-05-14 ENCOUNTER — Encounter (HOSPITAL_BASED_OUTPATIENT_CLINIC_OR_DEPARTMENT_OTHER): Payer: Managed Care, Other (non HMO) | Attending: Physician Assistant | Admitting: General Surgery

## 2023-05-14 DIAGNOSIS — E282 Polycystic ovarian syndrome: Secondary | ICD-10-CM | POA: Diagnosis not present

## 2023-05-14 DIAGNOSIS — E669 Obesity, unspecified: Secondary | ICD-10-CM | POA: Diagnosis not present

## 2023-05-14 DIAGNOSIS — E88819 Insulin resistance, unspecified: Secondary | ICD-10-CM | POA: Diagnosis not present

## 2023-05-14 DIAGNOSIS — Z6834 Body mass index (BMI) 34.0-34.9, adult: Secondary | ICD-10-CM | POA: Insufficient documentation

## 2023-05-14 DIAGNOSIS — L98499 Non-pressure chronic ulcer of skin of other sites with unspecified severity: Secondary | ICD-10-CM | POA: Diagnosis present

## 2023-05-14 NOTE — Progress Notes (Signed)
[name] ([id number]) [id number] Page 1 of 7 Visit Report for 05/14/2023 Chief Complaint Document Details Patient Name: Date of Service: [name], [name] 05/14/2023 12:45 PM Medical Record Number: [id number] Patient Account Number: [id number] Date of Birth/Sex: Treating RN: [name] (22 y.o. F) Primary Care Provider: Dorinda Hill Other Clinician: Referring Provider: Treating Provider/Extender: Jana Hakim in Treatment: 0 Information Obtained from: Patient Chief Complaint Patient presents to the wound care center due with non-wound condition(s) Electronic Signature(s) Signed: 05/14/2023 1:15:59 PM By: Duanne Guess MD FACS Entered By: Duanne Guess on 05/14/2023 10:15:58 -------------------------------------------------------------------------------- HPI Details Patient Name: Date of Service: [name]. 05/14/2023 12:45 PM Medical Record Number: [id number] Patient Account Number: [id number] Date of Birth/Sex: Treating RN: [birth year], [name] (22 y.o. F) Primary Care Provider: Dorinda Hill Other Clinician: Referring Provider: Treating Provider/Extender: Jana Hakim in Treatment: 0 History of Present Illness HPI Description: CONSULTATION ONLY 05/14/2023 This is [name] 22 year old with PCOS, insulin resistance, and obesity. She was referred by her primary care provider for [name] cutaneous lump in her mid sternum that had been present for [name] couple of months. It was squeezed by the PCP, expressing pus and blood and she was placed on doxycycline and told to apply mupirocin. She was then referred to the wound care center for further evaluation and management. In the interim from the time of her referral, to her actual appointment, the area completely resolved. There is [name] little bit of discoloration of her skin but no other residual signs. The patient reports that this is the second time it has occurred in roughly the  same location. Electronic Signature(s) Signed: 05/14/2023 1:20:26 PM By: Duanne Guess MD FACS Entered By: Duanne Guess on 05/14/2023 10:20:25 -------------------------------------------------------------------------------- Physical Exam Details Patient Name: Date of Service: [name]. 05/14/2023 12:45 PM Medical Record Number: [id number] Patient Account Number: [id number] Date of Birth/Sex: Treating RN: Jul [birth year], [name] (22 y.o. F) Primary Care Provider: Dorinda Hill Other Clinician: Evorn Gong ([id number]) [id number] Page 2 of 7 Referring Provider: Treating Provider/Extender: Jana Hakim in Treatment: 0 Constitutional .Tachycardic, asymptomatic. . . No acute distress. Respiratory Normal work of breathing on room air. Notes 05/14/2023: She does not have any open wounds today. The spot where the lesion was previously has [name] little bit of skin discoloration but given the patient's pigmentation, this may actually just be some scarring secondary to the trauma of having the site squeezed. I do not see [name] punctum or pore to suggest that this may have been [name] sebaceous cyst. I also do not see any signs of ingrown hair. Electronic Signature(s) Signed: 05/14/2023 1:[name]:48 PM By: Duanne Guess MD FACS Previous Signature: 05/14/2023 1:21:39 PM Version By: Duanne Guess MD FACS Entered By: Duanne Guess on 05/14/2023 10:[name]:48 -------------------------------------------------------------------------------- Physician Orders Details Patient Name: Date of Service: [name]. 05/14/2023 12:45 PM Medical Record Number: [id number] Patient Account Number: [id number] Date of Birth/Sex: Treating RN: [name], [name] (22 y.o. 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 use [name] warm compress on it while  waiting for an appointment. Electronic Signature(s) Signed: 05/14/2023 1:27:02 PM By: Duanne Guess MD FACS Entered By: Duanne Guess on 05/14/2023 10:27:02 -------------------------------------------------------------------------------- Problem List Details Patient Name: Date of Service: [name]. 05/14/2023 12:45 PM Medical Record Number: [id number] Patient Account Number: [id number] Date of Birth/Sex: Treating RN: [name][birth year] (22 y.o. 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 05/14/2023 12:45 PM Medical Record Number: [id number] Patient Account Number: [id number] Date of Birth/Sex: Treating RN: [name] (22 y.o. F) Primary Care Provider: Dorinda Hill Other Clinician: Referring Provider: Treating Provider/Extender: Jana Hakim in Treatment: 0 Subjective Chief  Complaint Information obtained from Patient Patient presents to the wound care center due with non-wound condition(s) History of Present Illness (HPI) CONSULTATION ONLY 05/14/2023 This is [name] 22 year old with PCOS, insulin resistance, and obesity. She was referred by her primary care provider for [name] cutaneous lump in her mid sternum that had been present for [name] couple of months. It was squeezed by the PCP, expressing pus and blood and she was placed on doxycycline and told to apply mupirocin. She was then referred to the wound care center for further evaluation and management. In the interim from the time of her referral, to her actual appointment, the area completely resolved. There is [name] little bit of discoloration of her skin but no other residual signs. The patient reports that this is the second time it has occurred in roughly the same location. Patient History Information obtained from Patient, Chart. Allergies latex Family History Unknown History. Social History Never smoker, Marital Status - Single, Alcohol Use - Rarely, Drug Use - No History, Caffeine Use - Never. Medical History Endocrine Denies history of Type II Diabetes Hospitalization/Surgery History - Cholecystectomy. Medical [name] Surgical History Notes nd Constitutional Symptoms (General Health) obese Hematologic/Lymphatic vitamin D deficiency Gastrointestinal IBS, nonalcoholic fatty liver disease Endocrine insulin resistance Genitourinary NEAVE, SHAMES [name] ([id number]) [id number] Page 4 of 7 Acquired primary ovarian hypogonadism, Amenorrhea, PCOS Neurologic migraines Psychiatric ADD, anxiety, depression Review of Systems (ROS) Eyes Denies complaints or symptoms of Dry Eyes, Vision Changes, Glasses / Contacts. Ear/Nose/Mouth/Throat Denies complaints or symptoms of Chronic sinus problems or rhinitis. Respiratory Denies complaints or symptoms of Chronic or frequent coughs, Shortness of  Breath. Cardiovascular Denies complaints or symptoms of Chest pain. Endocrine Denies complaints or symptoms of Heat/cold intolerance. Integumentary (Skin) Denies complaints or symptoms of Wounds. Musculoskeletal Denies complaints or symptoms of Muscle Pain, Muscle Weakness. Neurologic Denies complaints or symptoms of Numbness/parasthesias. Objective Constitutional Tachycardic, asymptomatic. No acute distress. Vitals Time Taken: 12:51 PM, Height: 62 in, Source: Stated, Weight: 190 lbs, Source: Stated, BMI: 34.7, Temperature: 98.4 F, Pulse: 117 bpm, Respiratory Rate: 18 breaths/min, Blood Pressure: 120/77 mmHg. Respiratory Normal work of breathing on room air. General Notes: 05/14/2023: She does not have any open wounds today. The spot where the lesion was previously has [name] little bit of skin discoloration but given the patient's pigmentation, this may actually just be some scarring secondary to the trauma of  having the site squeezed. I do not see [name] punctum or pore to suggest that this may have been [name] sebaceous cyst. I also do not see any signs of ingrown hair. Assessment Active Problems ICD-10 Non-pressure chronic ulcer of skin of other sites with unspecified severity Obesity, unspecified Plan Discharge From [facility] Services: Discharge from Wound Care Center - if it comes back, just give [place] [name] call to get an appointment (would prefer to see it before squeezing it). use [name] warm compress on it while waiting for an appointment. 05/14/2023: This is [name] 22 year old who was referred to [place] for what sounds like an abscess. She does not have any open wounds today. The spot where the lesion was previously has [name] little bit of skin discoloration but given the patient's pigmentation, this may actually just be some scarring secondary to the trauma of having the site squeezed. I do not see [name] punctum or pore to suggest that this may have been [name] sebaceous cyst. I also do not see any signs of ingrown hair. We  discussed the possibilities of the etiology, including sebaceous cyst, simple boil, or infected ingrown hair. I suggested that if it recurs again, that she contact us and avoid squeezing it so that I can better evaluate what it might be. I suggest using warm compresses to potentially permit spontaneous drainage while she waits for an appointment. She has no need for wound care services at this time, but she may return in the future if needed. Electronic Signature(s) Signed: 05/14/2023 1:28:46 PM By: Duanne Guess MD FACS [place],Signed: 05/14/2023 1:28:46 PM By: Duanne Guess MD FACS Ross Marcus ([id number]) [id number] Page 5 of 7 Entered By: Duanne Guess on 05/14/2023 10:28:46 -------------------------------------------------------------------------------- HxROS Details Patient Name: Date of Service: [name], [name] 05/14/2023 12:45 PM Medical Record Number: [id number] Patient Account Number: [id number] Date of Birth/Sex: Treating RN: Sep [birth year], [name] (22 y.o. [name] Primary Care Provider: Dorinda Hill Other Clinician: Referring Provider: Treating Provider/Extender: Jana Hakim in Treatment: 0 Information Obtained From Patient Chart Eyes Complaints and Symptoms: Negative for: Dry Eyes; Vision Changes; Glasses / Contacts Ear/Nose/Mouth/Throat Complaints and Symptoms: Negative for: Chronic sinus problems or rhinitis Respiratory Complaints and Symptoms: Negative for: Chronic or frequent coughs; Shortness of Breath Cardiovascular Complaints and Symptoms: Negative for: Chest pain Endocrine Complaints and Symptoms: Negative for: Heat/cold intolerance Medical History: Negative for: Type II Diabetes Past Medical History Notes: insulin resistance Integumentary (Skin) Complaints and Symptoms: Negative for: Wounds Musculoskeletal Complaints and Symptoms: Negative for: Muscle Pain; Muscle  Weakness Neurologic Complaints and Symptoms: Negative for: Numbness/parasthesias Medical History: Past Medical History Notes: migraines Constitutional Symptoms (General Health) Medical History: Past Medical History Notes: obese Hematologic/Lymphatic [name], [name] ([id number]) [id number] Page 6 of 7 Medical History: Past Medical History Notes: vitamin D deficiency Gastrointestinal Medical History: Past Medical History Notes: IBS, nonalcoholic fatty liver disease Genitourinary Medical History: Past Medical History Notes: Acquired primary ovarian hypogonadism, Amenorrhea, PCOS Immunological Oncologic Psychiatric Medical History: Past Medical History Notes: ADD, anxiety, depression Immunizations Pneumococcal Vaccine: Received Pneumococcal Vaccination: No Implantable Devices None Hospitalization / Surgery History Type of Hospitalization/Surgery Cholecystectomy Family and Social History Unknown History: Yes; Never smoker; Marital Status - Single; Alcohol Use: Rarely; Drug Use: No History; Caffeine Use: Never; Financial Concerns: No; Food, Clothing or Shelter Needs: No; Support System Lacking: No; Transportation Concerns: No Electronic Signature(s) Signed: 05/14/2023 2:05:59 PM By: Duanne Guess MD FACS Signed: 05/14/2023 4:12:59 PM By: Samuella Bruin Entered By: Samuella Bruin on 05/14/2023 09:53:31 --------------------------------------------------------------------------------  SuperBill Details Patient Name: Date of Service: [name], [name] 05/14/2023 Medical Record Number: [id number] Patient Account Number: [id number] Date of Birth/Sex: Treating RN: [birth year], [name] (22 y.o. [name] Primary Care Provider: Dorinda Hill Other Clinician: Referring Provider: Treating Provider/Extender: Jana Hakim in Treatment: 0 Diagnosis Coding ICD-10 Codes Code Description L98.499 Non-pressure chronic ulcer  of skin of other sites with unspecified severity E66.9 Obesity, unspecified Facility Procedures : [name], [name] 7 CPT4 Code: REMELY [name] ([id number]) [id number] Description: [id number] - WOUND CARE VISIT-LEV 3 EST PT Modifier: [id number] 1 Quantity: [id number].pdf Page 7 of 7 Physician Procedures : CPT4 Code Description Modifier [id number] WC PHYS LEVEL 3 NEW PT ICD-10 Diagnosis Description 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

## 2023-05-14 NOTE — Progress Notes (Signed)
[name] ([id number]) [id number] Page 1 of 4 Visit Report for 05/14/2023 Abuse Risk Screen Details Patient Name: Date of Service: [name], [name] 05/14/2023 12:45 PM Medical Record Number: [id number] Patient Account Number: [id number] Date of Birth/Sex: Treating RN: 04-Oct-2000 (22 y.o. Fredderick Phenix Primary Care Maegan Buller: Dorinda Hill Other Clinician: Referring Mychael Smock: Treating Antoney Biven/Extender: Jana Hakim in Treatment: 0 Abuse Risk Screen Items Answer ABUSE RISK SCREEN: Has anyone close to you tried to hurt or harm you recentlyo No Do you feel uncomfortable with anyone in your familyo No Has anyone forced you do things that you didnt want to doo No Electronic Signature(s) Signed: 05/14/2023 4:12:59 PM By: Samuella Bruin Entered By: Samuella Bruin on 05/14/2023 12:53:38 -------------------------------------------------------------------------------- Activities of Daily Living Details Patient Name: Date of Service: [name] 05/14/2023 12:45 PM Medical Record Number: [id number] Patient Account Number: [id number] Date of Birth/Sex: Treating RN: 08/28/2001 (22 y.o. Fredderick Phenix Primary Care Janes Colegrove: Dorinda Hill Other Clinician: Referring Mazikeen Hehn: Treating Jhase Creppel/Extender: Jana Hakim in Treatment: 0 Activities of Daily Living Items Answer Activities of Daily Living (Please select one for each item) Drive Automobile Not Able T Medications ake Completely Able Use T elephone Completely Able Care for Appearance Completely Able Use T oilet Completely Able Bath / Shower Completely Able Dress Self Completely Able Feed Self Completely Able Walk Completely Able Get In / Out Bed Completely Able Housework Completely Able Prepare Meals Completely Able Handle Money Completely Able Shop for Self Completely Able Electronic Signature(s) Signed: 05/14/2023  4:12:59 PM By: Samuella Bruin Entered By: Samuella Bruin on 05/14/2023 12:53:52 Kenney Houseman [name] ([id number]) [id number] Page 2 of 4 -------------------------------------------------------------------------------- Education Screening Details Patient Name: Date of Service: Georganna Skeans NYONNA, STANFILL 05/14/2023 12:45 PM Medical Record Number: [id number] Patient Account Number: [id number] Date of Birth/Sex: Treating RN: Mar 02, 2001 (22 y.o. Fredderick Phenix Primary Care Myosha Cuadras: Dorinda Hill Other Clinician: Referring Jun Osment: Treating Shadie Sweatman/Extender: Jana Hakim in Treatment: 0 Primary Learner Assessed: Patient Learning Preferences/Education Level/Primary Language Learning Preference: Explanation, Demonstration, Video, Printed Material Highest Education Level: College or Above Preferred Language: English Cognitive Barrier Language Barrier: No Translator Needed: No Memory Deficit: No Emotional Barrier: No Cultural/Religious Beliefs Affecting Medical Care: No Physical Barrier Impaired Vision: No Impaired Hearing: No Decreased Hand dexterity: No Knowledge/Comprehension Knowledge Level: Medium Comprehension Level: Medium Ability to understand written instructions: Medium Ability to understand verbal instructions: Medium Motivation Anxiety Level: Calm Cooperation: Cooperative Education Importance: Acknowledges Need Interest in Health Problems: Asks Questions Perception: Coherent Willingness to Engage in Self-Management Medium Activities: Readiness to Engage in Self-Management Medium Activities: Electronic Signature(s) Signed: 05/14/2023 4:12:59 PM By: Samuella Bruin Entered By: Samuella Bruin on 05/14/2023 12:54:13 -------------------------------------------------------------------------------- Fall Risk Assessment Details Patient Name: Date of Service: Jill HA Hali Marry [name]. 05/14/2023 12:45 PM Medical  Record Number: [id number] Patient Account Number: [id number] Date of Birth/Sex: Treating RN: Oct 01, 2000 (22 y.o. Fredderick Phenix Primary Care Jakeim Sedore: Dorinda Hill Other Clinician: Referring Brehanna Deveny: Treating Jess Toney/Extender: Jana Hakim in Treatment: 0 Fall Risk Assessment Items Have you had 2 or more falls in the last 12 monthso 0 No TIEGAN, KATAYAMA [name] ([id number]) [phone] [id number] Page 3 of 4 Have you had any fall that resulted in injury in the last 12 monthso 0 No FALLS RISK SCREEN History of falling - immediate or within 3 months 0 No Secondary diagnosis (Do you have 2 or more medical diagnoseso) 0 No Ambulatory aid None/bed  rest/wheelchair/nurse 0 Yes Crutches/cane/walker 0 No Furniture 0 No Intravenous therapy Access/Saline/Heparin Lock 0 No Gait/Transferring Normal/ bed rest/ wheelchair 0 Yes Weak (short steps with or without shuffle, stooped but able to lift head while walking, may seek 0 No support from furniture) Impaired (short steps with shuffle, may have difficulty arising from chair, head down, impaired 0 No balance) Mental Status Oriented to own ability 0 Yes Electronic Signature(s) Signed: 05/14/2023 4:12:59 PM By: Samuella Bruin Entered By: Samuella Bruin on 05/14/2023 12:54:20 -------------------------------------------------------------------------------- Foot Assessment Details Patient Name: Date of Service: Jill Squibb [name]. 05/14/2023 12:45 PM Medical Record Number: [id number] Patient Account Number: [id number] Date of Birth/Sex: Treating RN: 10-13-2000 (22 y.o. Fredderick Phenix Primary Care Darien Kading: Dorinda Hill Other Clinician: Referring Adanya Sosinski: Treating Shirlee Whitmire/Extender: Jana Hakim in Treatment: 0 Foot Assessment Items Site Locations + = Sensation present, - = Sensation absent, C = Callus, U = Ulcer R = Redness, W = Warmth, M = Maceration, PU =  Pre-ulcerative lesion F = Fissure, S = Swelling, D = Dryness Assessment Right: Left: Other Deformity: No No Prior Foot Ulcer: No No Prior Amputation: No No Charcot Joint: No No Ambulatory Status: Ambulatory Without Help GaitALEXEA, GARRAHAN [name] ([id number]) [phone].pdf Page 4 of 4 Electronic Signature(s) Signed: 05/14/2023 4:12:59 PM By: Gelene Mink By: Samuella Bruin on 05/14/2023 12:57:05 -------------------------------------------------------------------------------- Nutrition Risk Screening Details Patient Name: Date of Service: WESTON, LOAIZA 05/14/2023 12:45 PM Medical Record Number: [id number] Patient Account Number: [id number] Date of Birth/Sex: Treating RN: Mar 22, 2001 (22 y.o. Fredderick Phenix Primary Care Dezyre Hoefer: Dorinda Hill Other Clinician: Referring Ianmichael Amescua: Treating Kery Batzel/Extender: Jana Hakim in Treatment: 0 Height (in): 62 Weight (lbs): 190 Body Mass Index (BMI): 34.7 Nutrition Risk Screening Items Score Screening NUTRITION RISK SCREEN: I have an illness or condition that made me change the kind and/or amount of food I eat 0 No I eat fewer than two meals per day 0 No I eat few fruits and vegetables, or milk products 0 No I have three or more drinks of beer, liquor or wine almost every day 0 No I have tooth or mouth problems that make it hard for me to eat 0 No I don't always have enough money to buy the food I need 0 No I eat alone most of the time 0 No I take three or more different prescribed or over-the-counter drugs [name] day 0 No Without wanting to, I have lost or gained 10 pounds in the last six months 0 No I am not always physically able to shop, cook and/or feed myself 0 No Nutrition Protocols Good Risk Protocol 0 No interventions needed Moderate Risk Protocol High Risk Proctocol Risk Level: Good Risk Score: 0 Electronic Signature(s) Signed: 05/14/2023 4:12:59  PM By: Samuella Bruin Entered By: Samuella Bruin on 05/14/2023 12:56:58

## 2023-05-14 NOTE — Progress Notes (Signed)
KESHAWN, KELLAS [name] ([id number]) [id number] Page 1 of 6 Visit Report for 05/14/2023 Allergy List Details Patient Name: Date of Service: [name], [name] 05/14/2023 12:45 PM Medical Record Number: [id number] Patient Account Number: [id number] Date of Birth/Sex: Treating RN: February 25, 2001 (22 y.o. [name] Primary Care [name]: [name] Other Clinician: Referring [name]: Treating [name]/Extender: [name] in Treatment: 0 Allergies Active Allergies latex Allergy Notes Electronic Signature(s) Signed: 05/14/2023 4:12:59 PM By: Gelene Mink By: Samuella Bruin on 05/14/2023 05:54:50 -------------------------------------------------------------------------------- Arrival Information Details Patient Name: Date of Service: [name] [name]. 05/14/2023 12:45 PM Medical Record Number: [id number] Patient Account Number: [id number] Date of Birth/Sex: Treating RN: 2000-10-03 (22 y.o. [name] Primary Care [name]: [name] Other Clinician: Referring [name]: Treating [name]/Extender: [name] in Treatment: 0 Visit Information Patient Arrived: Ambulatory Arrival Time: 12:48 Accompanied By: father Transfer Assistance: None Patient Identification Verified: Yes Secondary Verification Process Completed: Yes Electronic Signature(s) Signed: 05/14/2023 4:12:59 PM By: Samuella Bruin Entered By: Samuella Bruin on 05/14/2023 09:51:48 -------------------------------------------------------------------------------- Clinic Level of Care Assessment Details Patient Name: Date of Service: [name], [name] 05/14/2023 12:45 PM Medical Record Number: [id number] Patient Account Number: [id number] Date of Birth/Sex: Treating RN: 2000/09/27 (22 y.o. [name] Primary Care [name]: [name] Other Clinician: Evorn [name] ([id number])  [id number] Page 2 of 6 Referring [name]: Treating Demaree [name]/Extender: [name] in Treatment: 0 Clinic Level of Care Assessment Items TOOL 2 Quantity Score X- 1 0 Use when only an EandM is performed on the INITIAL visit ASSESSMENTS - Nursing Assessment / Reassessment X- 1 20 General Physical Exam (combine w/ comprehensive assessment (listed just below) when performed on new pt. evals) X- 1 25 Comprehensive Assessment (HX, ROS, Risk Assessments, Wounds Hx, etc.) ASSESSMENTS - Wound and Skin [name] ssessment / Reassessment X - Simple Wound Assessment / Reassessment - one wound 1 5 []  - 0 Complex Wound Assessment / Reassessment - multiple wounds []  - 0 Dermatologic / Skin Assessment (not related to wound area) ASSESSMENTS - Ostomy and/or Continence Assessment and Care []  - 0 Incontinence Assessment and Management []  - 0 Ostomy Care Assessment and Management (repouching, etc.) PROCESS - Coordination of Care X - Simple Patient / Family Education for ongoing care 1 15 []  - 0 Complex (extensive) Patient / Family Education for ongoing care X- 1 10 Staff obtains Chiropractor, Records, T Results / Process Orders est []  - 0 Staff telephones HHA, Nursing Homes / Clarify orders / etc []  - 0 Routine Transfer to another Facility (non-emergent condition) []  - 0 Routine Hospital Admission (non-emergent condition) []  - 0 New Admissions / Manufacturing engineer / Ordering NPWT Apligraf, etc. , []  - 0 Emergency Hospital Admission (emergent condition) X- 1 10 Simple Discharge Coordination []  - 0 Complex (extensive) Discharge Coordination PROCESS - Special Needs []  - 0 Pediatric / Minor Patient Management []  - 0 Isolation Patient Management []  - 0 Hearing / Language / Visual special needs []  - 0 Assessment of Community assistance (transportation, D/C planning, etc.) []  - 0 Additional assistance / Altered mentation []  - 0 Support  Surface(s) Assessment (bed, cushion, seat, etc.) INTERVENTIONS - Wound Cleansing / Measurement []  - 0 Wound Imaging (photographs - any number of wounds) []  - 0 Wound Tracing (instead of photographs) []  - 0 Simple Wound Measurement - one wound []  - 0 Complex Wound Measurement - multiple wounds []  - 0 Simple Wound Cleansing - one wound []  -  0 Complex Wound Cleansing - multiple wounds INTERVENTIONS - Wound Dressings []  - 0 Small Wound Dressing one or multiple wounds []  - 0 Medium Wound Dressing one or multiple wounds []  - 0 Large Wound Dressing one or multiple wounds []  - 0 Application of Medications - injection INTERVENTIONS - Miscellaneous [name] ([id number]) [phone] Page 3 of 6 []  - 0 External ear exam []  - 0 Specimen Collection (cultures, biopsies, blood, body fluids, etc.) []  - 0 Specimen(s) / Culture(s) sent or taken to Lab for analysis []  - 0 Patient Transfer (multiple staff / Michiel Sites Lift / Similar devices) []  - 0 Simple Staple / Suture removal (25 or less) []  - 0 Complex Staple / Suture removal (26 or more) []  - 0 Hypo / Hyperglycemic Management (close monitor of Blood Glucose) []  - 0 Ankle / Brachial Index (ABI) - do not check if billed separately Has the patient been seen at the hospital within the last three years: Yes Total Score: 85 Level Of Care: New/Established - Level 3 Electronic Signature(s) Signed: 05/14/2023 4:12:59 PM By: Samuella Bruin Entered By: Samuella Bruin on 05/14/2023 10:03:48 -------------------------------------------------------------------------------- Encounter Discharge Information Details Patient Name: Date of Service: [name] [name]. 05/14/2023 12:45 PM Medical Record Number: [id number] Patient Account Number: [id number] Date of Birth/Sex: Treating RN: August 15, 2001 (22 y.o. [name] Primary Care Douglass Dunshee: [name] Other Clinician: Referring Anam Bobby: Treating  Emilia Kayes/Extender: [name] in Treatment: 0 Encounter Discharge Information Items Discharge Condition: Stable Ambulatory Status: Ambulatory Discharge Destination: Home Transportation: Private Auto Accompanied By: father Schedule Follow-up Appointment: No Clinical Summary of Care: Patient Declined Electronic Signature(s) Signed: 05/14/2023 4:12:59 PM By: Samuella Bruin Entered By: Samuella Bruin on 05/14/2023 10:09:22 -------------------------------------------------------------------------------- Lower Extremity Assessment Details Patient Name: Date of Service: [name], [name]. 05/14/2023 12:45 PM Medical Record Number: [id number] Patient Account Number: [id number] Date of Birth/Sex: Treating RN: May 15, 2001 (22 y.o. [name] Primary Care Belinda Bringhurst: [name] Other Clinician: Referring Juleon Narang: Treating Haruka Kowaleski/Extender: [name] in Treatment: 0 Electronic Signature(s) Signed: 05/14/2023 4:12:59 PM By: Samuella Bruin Entered By: Samuella Bruin on 05/14/2023 09:57:09 Kenney Houseman [name] ([id number]) [id number] Page 4 of 6 -------------------------------------------------------------------------------- Multi Wound Chart Details Patient Name: Date of Service: [name], [name] 05/14/2023 12:45 PM Medical Record Number: [id number] Patient Account Number: [id number] Date of Birth/Sex: Treating RN: November 25, 2000 (22 y.o. F) Primary Care Georg Ang: [name] Other Clinician: Referring Amilyah Nack: Treating Mariaha Ellington/Extender: [name] in Treatment: 0 Vital Signs Height(in): 62 Pulse(bpm): 117 Weight(lbs): 190 Blood Pressure(mmHg): 120/77 Body Mass Index(BMI): 34.7 Temperature(F): 98.4 Respiratory Rate(breaths/min): 18 [Treatment Notes:Wound Assessments Treatment Notes] Electronic Signature(s) Signed: 05/14/2023 1:15:48 PM By: Duanne Guess MD  FACS Entered By: Duanne Guess on 05/14/2023 10:15:48 -------------------------------------------------------------------------------- Multi-Disciplinary Care Plan Details Patient Name: Date of Service: [name] [name]. 05/14/2023 12:45 PM Medical Record Number: [id number] Patient Account Number: [id number] Date of Birth/Sex: Treating RN: [birth year] (22 y.o. [name] Primary Care Margaurite Salido: [name] Other Clinician: Referring Jasha Hodzic: Treating Borghild Thaker/Extender: [name] in Treatment: 0 Active Inactive Electronic Signature(s) Signed: 05/14/2023 4:12:59 PM By: Gelene Mink By: Samuella Bruin on 05/14/2023 10:02:48 -------------------------------------------------------------------------------- Pain Assessment Details Patient Name: Date of Service: [name], [name] 05/14/2023 12:45 PM Medical Record Number: [id number] Patient Account Number: [id number] Date of Birth/Sex: Treating RN: [birth year] (22 y.o. [name] Primary Care Pierra Skora: [name] Other Clinician: Referring Leslea Vowles: Treating Badger Jon/Extender: [name]  in Treatment: 0 [name], [name] ([id number]) [id number] Page 5 of 6 Active Problems Location of Pain Severity and Description of Pain Patient Has Paino No Site Locations Rate the pain. Current Pain Level: 0 Pain Management and Medication Current Pain Management: Electronic Signature(s) Signed: 05/14/2023 4:12:59 PM By: Samuella Bruin Entered By: Samuella Bruin on 05/14/2023 09:57:56 -------------------------------------------------------------------------------- Patient/Caregiver Education Details Patient Name: Date of Service: Jill HA [name] 9/9/2024andnbsp12:45 PM Medical Record Number: [id number] Patient Account Number: [id number] Date of Birth/Gender: Treating RN: [birth year] (22 y.o. [name] Primary  Care Physician: [name] Other Clinician: Referring Physician: Treating Physician/Extender: [name] in Treatment: 0 Education Assessment Education Provided To: Patient Education Topics Provided Safety: Methods: Explain/Verbal Responses: Reinforcements needed, State content correctly Electronic Signature(s) Signed: 05/14/2023 4:12:59 PM By: Samuella Bruin Entered By: Samuella Bruin on 05/14/2023 10:03:04 Kenney Houseman [name] ([id number]) [id number] Page 6 of 6 -------------------------------------------------------------------------------- Vitals Details Patient Name: Date of Service: [name], [name] 05/14/2023 12:45 PM Medical Record Number: [id number] Patient Account Number: [id number] Date of Birth/Sex: Treating RN: 2000/11/11 (22 y.o. [name] Primary Care Lillyen Schow: [name] Other Clinician: Referring Ottie Tillery: Treating Capucine Tryon/Extender: [name] in Treatment: 0 Vital Signs Time Taken: 12:51 Temperature (F): 98.4 Height (in): 62 Pulse (bpm): 117 Source: Stated Respiratory Rate (breaths/min): 18 Weight (lbs): 190 Blood Pressure (mmHg): 120/77 Source: Stated Reference Range: 80 - 120 mg / dl Body Mass Index (BMI): 34.7 Electronic Signature(s) Signed: 05/14/2023 4:12:59 PM By: Samuella Bruin Entered By: Samuella Bruin on 05/14/2023 09:52:25

## 2023-06-06 ENCOUNTER — Encounter (INDEPENDENT_AMBULATORY_CARE_PROVIDER_SITE_OTHER): Payer: Self-pay | Admitting: Family Medicine

## 2023-06-06 ENCOUNTER — Ambulatory Visit (INDEPENDENT_AMBULATORY_CARE_PROVIDER_SITE_OTHER): Payer: Managed Care, Other (non HMO) | Admitting: Family Medicine

## 2023-06-06 VITALS — BP 106/71 | HR 97 | Temp 98.9°F | Ht 62.0 in | Wt 194.0 lb

## 2023-06-06 DIAGNOSIS — Z6835 Body mass index (BMI) 35.0-35.9, adult: Secondary | ICD-10-CM

## 2023-06-06 DIAGNOSIS — F419 Anxiety disorder, unspecified: Secondary | ICD-10-CM | POA: Diagnosis not present

## 2023-06-06 DIAGNOSIS — E669 Obesity, unspecified: Secondary | ICD-10-CM | POA: Diagnosis not present

## 2023-06-06 DIAGNOSIS — E559 Vitamin D deficiency, unspecified: Secondary | ICD-10-CM | POA: Diagnosis not present

## 2023-06-06 NOTE — Progress Notes (Signed)
Chief Complaint:   OBESITY [name] is here to discuss her progress with her obesity treatment plan along with follow-up of her obesity related diagnoses. [name] is on keeping a food journal and adhering to recommended goals of 1400-1500 calories and 90+ grams of protein and states she is following her eating plan approximately 60% of the time. [name] states she is doing 0 minutes 0 times per week.  Today's visit was #: 12 Starting weight: 216 lbs Starting date: 03/22/2022 Today's weight: 194 lbs Today's date: 06/06/2023 Total lbs lost to date: 22 Total lbs lost since last in-office visit: 0  Interim History: Patient said that she did not start the class she anticipated due to the public speaking.  She is planning to sign up for online classes in the next few days.  Foodwise she is getting in quite a bit of protein like chicken and bacon and apples.  She is focusing on ensuring she is getting enough protein in daily.  She is averaging a 1475 calories a day and getting about 86g of protein on average.  She really enjoys beef jerky. Her knee is bothering her recently and she is seeing someone in 2 days for evaluation.   Subjective:   1. Vitamin D deficiency Patient denies nausea, vomiting, or muscle weakness but notes fatigue.  She is on prescription vitamin D.  Her last vitamin D level was 33.6.  2. Anxiety Patient just started on Vistaril, and she is noticing an increase in fatigue on that.  Assessment/Plan:   1. Vitamin D deficiency Patient will continue vitamin D 50,000 IU once weekly, no refill needed.  2. Anxiety We will follow-up on patient's symptoms at her next appointment.  3. BMI 35.0-35.9,adult  4. Obesity with starting BMI of 39.5 [name] is currently in the action stage of change. As such, her goal is to continue with weight loss efforts. She has agreed to keeping a food journal and adhering to recommended goals of 1450-1600 calories and 90+ grams of protein  daily.   Exercise goals: All adults should avoid inactivity. Some physical activity is better than none, and adults who participate in any amount of physical activity gain some health benefits.  Behavioral modification strategies: increasing lean protein intake, meal planning and cooking strategies, keeping healthy foods in the home, and planning for success.  [name] has agreed to follow-up with our clinic in 4 to 5 weeks. She was informed of the importance of frequent follow-up visits to maximize her success with intensive lifestyle modifications for her multiple health conditions.   Objective:   Blood pressure 106/71, pulse 97, temperature 98.9 F (37.2 C), height 5\' 2"  (1.575 m), weight 194 lb (88 kg), SpO2 94%. Body mass index is 35.48 kg/m.  General: Cooperative, alert, well developed, in no acute distress. HEENT: Conjunctivae and lids unremarkable. Cardiovascular: Regular rhythm.  Lungs: Normal work of breathing. Neurologic: No focal deficits.   Lab Results  Component Value Date   CREATININE 0.63 03/19/2023   BUN 6 03/19/2023   NA 142 03/19/2023   K 4.1 03/19/2023   CL 102 03/19/2023   CO2 21 03/19/2023   Lab Results  Component Value Date   ALT 26 03/19/2023   AST 19 03/19/2023   ALKPHOS 79 03/19/2023   BILITOT 0.2 03/19/2023   Lab Results  Component Value Date   HGBA1C 5.3 03/19/2023   HGBA1C 5.4 08/23/2022   HGBA1C 5.5 03/22/2022   Lab Results  Component Value Date   INSULIN  26.1 (H) 03/19/2023   INSULIN 24.8 08/23/2022   INSULIN 37.1 (H) 03/22/2022   Lab Results  Component Value Date   TSH 2.69 03/09/2022   Lab Results  Component Value Date   CHOL 181 03/19/2023   HDL 60 03/19/2023   LDLCALC 95 03/19/2023   TRIG 151 (H) 03/19/2023   Lab Results  Component Value Date   VD25OH 33.6 03/19/2023   VD25OH 22.3 (L) 08/23/2022   VD25OH 27.3 (L) 03/22/2022   Lab Results  Component Value Date   WBC 13.7 03/09/2022   HGB 12.3 03/09/2022   HCT 39  03/09/2022   MCV 79 12/14/2020   PLT 439 (A) 03/09/2022   No results found for: "IRON", "TIBC", "FERRITIN"  Attestation Statements:   Reviewed by clinician on day of visit: allergies, medications, problem list, medical history, surgical history, family history, social history, and previous encounter notes.   I, Burt Knack, am acting as transcriptionist for Reuben Likes, MD.  I have reviewed the above documentation for accuracy and completeness, and I agree with the above. - Reuben Likes, MD

## 2023-07-11 ENCOUNTER — Ambulatory Visit (INDEPENDENT_AMBULATORY_CARE_PROVIDER_SITE_OTHER): Payer: Managed Care, Other (non HMO) | Admitting: Family Medicine

## 2023-07-24 ENCOUNTER — Emergency Department (HOSPITAL_BASED_OUTPATIENT_CLINIC_OR_DEPARTMENT_OTHER)
Admission: EM | Admit: 2023-07-24 | Discharge: 2023-07-24 | Discharge status: Against Medical Advice | Payer: Managed Care, Other (non HMO) | Attending: Emergency Medicine | Admitting: Emergency Medicine

## 2023-07-24 ENCOUNTER — Encounter (HOSPITAL_BASED_OUTPATIENT_CLINIC_OR_DEPARTMENT_OTHER): Payer: Self-pay

## 2023-07-24 ENCOUNTER — Other Ambulatory Visit: Payer: Self-pay

## 2023-07-24 DIAGNOSIS — R109 Unspecified abdominal pain: Secondary | ICD-10-CM | POA: Insufficient documentation

## 2023-07-24 DIAGNOSIS — Z5321 Procedure and treatment not carried out due to patient leaving prior to being seen by health care provider: Secondary | ICD-10-CM | POA: Diagnosis not present

## 2023-07-24 DIAGNOSIS — R197 Diarrhea, unspecified: Secondary | ICD-10-CM | POA: Insufficient documentation

## 2023-07-24 LAB — URINALYSIS, ROUTINE W REFLEX MICROSCOPIC
Bilirubin Urine: NEGATIVE
Glucose, UA: NEGATIVE mg/dL
Hgb urine dipstick: NEGATIVE
Ketones, ur: NEGATIVE mg/dL
Leukocytes,Ua: NEGATIVE
Nitrite: NEGATIVE
Specific Gravity, Urine: 1.029 (ref 1.005–1.030)
pH: 5.5 (ref 5.0–8.0)

## 2023-07-24 LAB — PREGNANCY, URINE: Preg Test, Ur: NEGATIVE

## 2023-07-24 NOTE — ED Triage Notes (Signed)
Pt POV reporting bilateral flank pain and diarrhea last few days. Denies n/v or other urinary sx.

## 2023-07-28 ENCOUNTER — Other Ambulatory Visit: Payer: Self-pay | Admitting: Obstetrics and Gynecology

## 2023-07-28 DIAGNOSIS — Z3041 Encounter for surveillance of contraceptive pills: Secondary | ICD-10-CM

## 2023-07-30 NOTE — Telephone Encounter (Signed)
Med refill request: [name] AEX: 06/22/22 Next AEX: not scheduled [name] MMG (if hormonal med) n/a Refill authorized: Please Advise, #[phone], 0 RF-- asked front to schedule pt for AEX

## 2023-08-06 ENCOUNTER — Encounter (INDEPENDENT_AMBULATORY_CARE_PROVIDER_SITE_OTHER): Payer: Self-pay | Admitting: Family Medicine

## 2023-08-06 ENCOUNTER — Ambulatory Visit (INDEPENDENT_AMBULATORY_CARE_PROVIDER_SITE_OTHER): Payer: Managed Care, Other (non HMO) | Admitting: Family Medicine

## 2023-08-06 VITALS — BP 118/78 | HR 96 | Temp 98.4°F | Ht 62.0 in | Wt 201.0 lb

## 2023-08-06 DIAGNOSIS — Z6839 Body mass index (BMI) 39.0-39.9, adult: Secondary | ICD-10-CM | POA: Diagnosis not present

## 2023-08-06 DIAGNOSIS — E66812 Obesity, class 2: Secondary | ICD-10-CM

## 2023-08-06 DIAGNOSIS — Z6836 Body mass index (BMI) 36.0-36.9, adult: Secondary | ICD-10-CM

## 2023-08-06 DIAGNOSIS — G43009 Migraine without aura, not intractable, without status migrainosus: Secondary | ICD-10-CM | POA: Diagnosis not present

## 2023-08-06 DIAGNOSIS — E559 Vitamin D deficiency, unspecified: Secondary | ICD-10-CM | POA: Diagnosis not present

## 2023-08-06 MED ORDER — VITAMIN D (ERGOCALCIFEROL) 1.25 MG (50000 UNIT) PO CAPS: 50000.0000 [IU] | ORAL_CAPSULE | ORAL | 0 refills | Status: AC

## 2023-08-06 NOTE — Assessment & Plan Note (Signed)
 Discussed importance of vitamin d supplementation.  Vitamin d supplementation has been shown to decrease fatigue, decrease risk of progression to insulin resistance and then prediabetes, decreases risk of falling in older age and can even assist in decreasing depressive symptoms in PTSD.   Prescription for Vitamin D sent in.

## 2023-08-06 NOTE — Assessment & Plan Note (Signed)
Patient getting Botox and has been restarted on Ajovy.  The combination of these two has better controlled her migraine frequency and intensity. She sees neurology for management.

## 2023-08-06 NOTE — Progress Notes (Signed)
   SUBJECTIVE:  Chief Complaint: Obesity  Interim History: Patient has had significant GI issues for the last month.  She mention she was only able to eat 6 food items. She hasn't been able to get much protein in.  She hasn't been able to exercise much due to discomfort.  She is planning to get colonoscopy and endoscopy on 08/14/23.  She tends to feel nauseous and has abdominal pain with beef jerky, bacon, cheese, certain types of crackers.  She has been drinking quite a bit of ginger ale.  Some food options are limited. Sometimes ginger ale improves the nauseousness but the pain is not improved with anything.  Cidney is here to discuss her progress with her obesity treatment plan. She is on the keeping a food journal and adhering to recommended goals of 1450-1600 calories and 90+ grams of protein and states she is following her eating plan approximately 2 % of the time. She states she is not exercising.   OBJECTIVE: Visit Diagnoses: Problem List Items Addressed This Visit       Cardiovascular and Mediastinum   Migraine without aura and without status migrainosus, not intractable    Patient getting Botox and has been restarted on Ajovy.  The combination of these two has better controlled her migraine frequency and intensity. She sees neurology for management.        Other   Vitamin D deficiency    Discussed importance of vitamin d supplementation.  Vitamin d supplementation has been shown to decrease fatigue, decrease risk of progression to insulin resistance and then prediabetes, decreases risk of falling in older age and can even assist in decreasing depressive symptoms in PTSD.   Prescription for Vitamin D sent in.        Relevant Medications   Vitamin D, Ergocalciferol, (DRISDOL) 1.25 MG (50000 UNIT) CAPS capsule   Class 2 severe obesity with serious comorbidity and body mass index (BMI) of 39.0 to 39.9 in adult (HCC) - Primary    Vitals Temp: 98.4 F (36.9 C) BP:  118/78 Pulse Rate: 96 SpO2: 99 %   Anthropometric Measurements Height: 5\' 2"  (1.575 m) Weight: 201 lb (91.2 kg) BMI (Calculated): 36.75 Weight at Last Visit: 194 lb Weight Lost Since Last Visit: 0 Weight Gained Since Last Visit: 7 lb Starting Weight: 216 lb   Body Composition  Body Fat %: 41 % Fat Mass (lbs): 82.4 lbs Muscle Mass (lbs): 112.6 lbs Total Body Water (lbs): 80 lbs Visceral Fat Rating : 8   Other Clinical Data Today's Visit #: 13 Starting Date: 06/06/23     ASSESSMENT AND PLAN:  Diet: Ellowyn is currently in the action stage of change. As such, her goal is to continue with weight loss efforts. She has agreed to practicing portion control and making smarter food choices, such as increasing vegetables and decreasing simple carbohydrates.  Exercise: Shanea has been instructed to continue exercising as is for weight loss and overall health benefits.   Behavior Modification:  We discussed the following Behavioral Modification Strategies today: increase H2O intake, meal planning and cooking strategies, and planning for success.   No follow-ups on file.Marland Kitchen She was informed of the importance of frequent follow up visits to maximize her success with intensive lifestyle modifications for her multiple health conditions.  Attestation Statements:   Reviewed by clinician on day of visit: allergies, medications, problem list, medical history, surgical history, family history, social history, and previous encounter notes.   Reuben Likes, MD

## 2023-08-14 ENCOUNTER — Ambulatory Visit: Payer: Managed Care, Other (non HMO)

## 2023-08-14 DIAGNOSIS — R197 Diarrhea, unspecified: Secondary | ICD-10-CM | POA: Diagnosis present

## 2023-08-14 DIAGNOSIS — K2950 Unspecified chronic gastritis without bleeding: Secondary | ICD-10-CM | POA: Diagnosis not present

## 2023-08-14 DIAGNOSIS — K640 First degree hemorrhoids: Secondary | ICD-10-CM | POA: Diagnosis not present

## 2023-09-03 ENCOUNTER — Other Ambulatory Visit: Payer: Self-pay | Admitting: *Deleted

## 2023-09-03 DIAGNOSIS — Z3041 Encounter for surveillance of contraceptive pills: Secondary | ICD-10-CM

## 2023-09-03 MED ORDER — DROSPIRENONE-ETHINYL ESTRADIOL 3-0.03 MG PO TABS: 1.0000 | ORAL_TABLET | Freq: Every day | ORAL | 1 refills | Status: DC

## 2023-09-03 NOTE — Telephone Encounter (Signed)
Call returned to patients father, Loraine Leriche, ok per dpr.   Dad reports yeast/vaginitis symptoms, OV scheduled by front office for 09/06/23. Added to wait list per dads request. Patient has tried OTC monistat with no relief.   Next AXE 12/11/23, will need additional refill until AEX. Rx pended. Last Rx sent 07/30/23, #84/0RF   Routing to Dr. Edward Jolly

## 2023-09-04 NOTE — Progress Notes (Signed)
 GYNECOLOGY  VISIT   HPI: 22 y.o.   Single  Caucasian Hispanic female   G0P0000 with Patient's last menstrual period was 09/03/2023.   here for: vaginal inf-- itching, vaginal odor and clumpy discharge over a week ago. Took Monistat 3 vaginally.  Last dosage was Dec. 23.    Had 2 yeast infections in the last year.     No recent abx.  Not sexually active.   Wants third Gardasil today.   New dx gastroparesis.   GYNECOLOGIC HISTORY: Patient's last menstrual period was 09/03/2023. Contraception:  OCP Menopausal hormone therapy:  n/a Last 2 paps:  n/a History of abnormal Pap or positive HPV:  no Mammogram:  n/a        OB History     Gravida  0   Para  0   Term  0   Preterm  0   AB  0   Living  0      SAB  0   IAB  0   Ectopic  0   Multiple  0   Live Births  0              Patient Active Problem List   Diagnosis Date Noted   Diabetes mellitus (HCC) 06/21/2022   Vitamin D  deficiency 06/21/2022   Other hyperlipidemia 06/21/2022   Class 2 severe obesity with serious comorbidity and body mass index (BMI) of 39.0 to 39.9 in adult (HCC) 06/21/2022   Insulin  resistance 06/21/2022   Side pain 06/21/2022   Chronic daily headache 01/12/2021   Anxiety 06/04/2020   Depressive disorder 06/04/2020   Abdominal pain 07/17/2018   NAFLD (nonalcoholic fatty liver disease) 91/71/7980   S/P nail surgery 01/27/2016   Ingrown toenail 01/20/2016   PCOS (polycystic ovarian syndrome) 10/08/2014   Tension headache 01/20/2013   Migraine without aura and without status migrainosus, not intractable 01/20/2013   ADD (attention deficit disorder) 01/27/2012    Past Medical History:  Diagnosis Date   Acquired primary ovarian hypogonadism    ADD (attention deficit disorder)    Amenorrhea    when cycles first started age 89   Anxiety    Depression    Elevated prolactin level 09/16/2018   Taking Pamelor.   Gallbladder problem    Headache(784.0)    migraine without  aura   IBS (irritable bowel syndrome)    PCOS (polycystic ovarian syndrome)    PCOS (polycystic ovarian syndrome)     Past Surgical History:  Procedure Laterality Date   CHOLECYSTECTOMY     CHOLECYSTECTOMY  2018    Current Outpatient Medications  Medication Sig Dispense Refill   Acetaminophen  (TYLENOL  PO) Take 1 tablet by mouth daily as needed (pain). Fast acting     desvenlafaxine (PRISTIQ) 50 MG 24 hr tablet Take 50 mg by mouth daily.     drospirenone -ethinyl estradiol  (SYEDA ) 3-0.03 MG tablet Take 1 tablet by mouth daily. 84 tablet 1   Fremanezumab-vfrm (AJOVY) 225 MG/1.5ML SOAJ Inject into the skin.     gabapentin (NEURONTIN) 300 MG capsule Take 300 mg by mouth 2 (two) times daily.     hydrOXYzine (VISTARIL) 25 MG capsule Take 25 mg by mouth daily.     pantoprazole (PROTONIX) 40 MG tablet Take 40 mg by mouth daily.     sodium fluoride (FLUORISHIELD) 1.1 % GEL dental gel PreviDent 5000 Dry Mouth 1.1 % dental paste     Vitamin D , Ergocalciferol , (DRISDOL ) 1.25 MG (50000 UNIT) CAPS capsule Take 1 capsule (50,000  Units total) by mouth every 7 (seven) days. 12 capsule 0   No current facility-administered medications for this visit.     ALLERGIES: Shrimp (diagnostic) and Latex  Family History  Adopted: Yes    Social History   Socioeconomic History   Marital status: Single    Spouse name: Not on file   Number of children: Not on file   Years of education: Not on file   Highest education level: Not on file  Occupational History   Occupation: Student  Tobacco Use   Smoking status: Never   Smokeless tobacco: Never  Vaping Use   Vaping status: Former  Substance and Sexual Activity   Alcohol  use: No   Drug use: No   Sexual activity: Never    Birth control/protection: Pill, Abstinence  Other Topics Concern   Not on file  Social History Narrative   Spends time between father and boarding school that she lives at. Address is father's    Right handed   Caffeine : 1  cup/day   Social Drivers of Corporate Investment Banker Strain: Not on file  Food Insecurity: Not on file  Transportation Needs: Not on file  Physical Activity: Not on file  Stress: Not on file  Social Connections: Not on file  Intimate Partner Violence: Not on file    Review of Systems  Genitourinary:  Positive for vaginal discharge.    PHYSICAL EXAMINATION:   BP 122/80 (BP Location: Left Arm, Patient Position: Sitting, Cuff Size: Small)   Pulse (!) 108   Ht 5' 1 (1.549 m)   Wt 204 lb (92.5 kg)   LMP 09/03/2023   SpO2 98%   BMI 38.55 kg/m     General appearance: alert, cooperative and appears stated age   Pelvic: External genitalia:  no lesions              Urethra:  normal appearing urethra with no masses, tenderness or lesions Q-tip passed along the vaginal walls.      No speculum exam or bimanual exam performed.               Chaperone was present for exam:  Damien FALCON, CMA  ASSESSMENT:  Vaginitis.  Gardasil vaccination.  Encounter for health education.   PLAN:  Wet prep:  positive for clue cells, negative yeast, negative trichomonas.  Rx for Flagyl  500 mg po bid x 7 days.  Rx for Diflucan  150 mg po x 1 prn yeast infection. May repeat in 72 hours prn.  Gardasil #3 today.  Fu for annual exam.  We discussed pelvic exams and pap smears today in preparation for her upcoming annual exam.  I used a 3D pelvic model and showed her a speculum and pap smear brush.   25 min  total time was spent for this patient encounter, including preparation, face-to-face counseling with the patient, coordination of care, and documentation of the encounter.

## 2023-09-06 ENCOUNTER — Ambulatory Visit (INDEPENDENT_AMBULATORY_CARE_PROVIDER_SITE_OTHER): Payer: Self-pay | Admitting: Obstetrics and Gynecology

## 2023-09-06 ENCOUNTER — Encounter: Payer: Self-pay | Admitting: Obstetrics and Gynecology

## 2023-09-06 VITALS — BP 122/80 | HR 108 | Ht 61.0 in | Wt 204.0 lb

## 2023-09-06 DIAGNOSIS — Z23 Encounter for immunization: Secondary | ICD-10-CM | POA: Diagnosis not present

## 2023-09-06 DIAGNOSIS — N760 Acute vaginitis: Secondary | ICD-10-CM | POA: Diagnosis not present

## 2023-09-06 DIAGNOSIS — Z719 Counseling, unspecified: Secondary | ICD-10-CM

## 2023-09-06 LAB — WET PREP FOR TRICH, YEAST, CLUE

## 2023-09-06 MED ORDER — METRONIDAZOLE 500 MG PO TABS: 500.0000 mg | ORAL_TABLET | Freq: Two times a day (BID) | ORAL | 0 refills | Status: DC

## 2023-09-06 MED ORDER — FLUCONAZOLE 150 MG PO TABS: 150.0000 mg | ORAL_TABLET | Freq: Once | ORAL | 0 refills | Status: AC

## 2023-09-06 NOTE — Patient Instructions (Signed)

## 2023-10-15 ENCOUNTER — Ambulatory Visit (INDEPENDENT_AMBULATORY_CARE_PROVIDER_SITE_OTHER): Payer: Managed Care, Other (non HMO) | Admitting: Family Medicine

## 2023-10-18 DIAGNOSIS — G43709 Chronic migraine without aura, not intractable, without status migrainosus: Secondary | ICD-10-CM | POA: Diagnosis not present

## 2023-11-01 DIAGNOSIS — G43709 Chronic migraine without aura, not intractable, without status migrainosus: Secondary | ICD-10-CM | POA: Diagnosis not present

## 2023-11-07 DIAGNOSIS — F4010 Social phobia, unspecified: Secondary | ICD-10-CM | POA: Diagnosis not present

## 2023-11-07 DIAGNOSIS — F4321 Adjustment disorder with depressed mood: Secondary | ICD-10-CM | POA: Diagnosis not present

## 2023-11-27 NOTE — Progress Notes (Deleted)
 23 y.o. G0P0000 Single Caucasian Hispanic female here for annual exam.    PCP: Melida Quitter, MD   No LMP recorded. (Menstrual status: Oral contraceptives).           Sexually active: No.  The current method of family planning is abstinence.    Menopausal hormone therapy:  n/a Exercising: {yes no:314532}  {types:19826} Smoker:  no  OB History  Gravida Para Term Preterm AB Living  0 0 0 0 0 0  SAB IAB Ectopic Multiple Live Births  0 0 0 0 0     HEALTH MAINTENANCE: Last 2 paps:  n/a History of abnormal Pap or positive HPV:  no Mammogram:   n/a Colonoscopy:  n/a Bone Density:  n/a  Result  n/a   Immunization History  Administered Date(s) Administered   HPV 9-valent 02/15/2021, 06/22/2022, 09/06/2023   Influenza,inj,Quad PF,6+ Mos 06/22/2022   Meningococcal Conjugate 04/04/2022   Tdap 01/30/2022      reports that she has never smoked. She has never used smokeless tobacco. She reports that she does not drink alcohol and does not use drugs.  Past Medical History:  Diagnosis Date   Acquired primary ovarian hypogonadism    ADD (attention deficit disorder)    Amenorrhea    when cycles first started age 45   Anxiety    Depression    Elevated prolactin level 09/16/2018   Taking Pamelor.   Gallbladder problem    Headache(784.0)    migraine without aura   IBS (irritable bowel syndrome)    PCOS (polycystic ovarian syndrome)    PCOS (polycystic ovarian syndrome)     Past Surgical History:  Procedure Laterality Date   CHOLECYSTECTOMY     CHOLECYSTECTOMY  2018    Current Outpatient Medications  Medication Sig Dispense Refill   Acetaminophen (TYLENOL PO) Take 1 tablet by mouth daily as needed (pain). Fast acting     desvenlafaxine (PRISTIQ) 50 MG 24 hr tablet Take 50 mg by mouth daily.     drospirenone-ethinyl estradiol (SYEDA) 3-0.03 MG tablet Take 1 tablet by mouth daily. 84 tablet 1   Fremanezumab-vfrm (AJOVY) 225 MG/1.5ML SOAJ Inject into the skin.      gabapentin (NEURONTIN) 300 MG capsule Take 300 mg by mouth 2 (two) times daily.     hydrOXYzine (VISTARIL) 25 MG capsule Take 25 mg by mouth daily.     metroNIDAZOLE (FLAGYL) 500 MG tablet Take 1 tablet (500 mg total) by mouth 2 (two) times daily. Take twice daily for one week. 14 tablet 0   pantoprazole (PROTONIX) 40 MG tablet Take 40 mg by mouth daily.     sodium fluoride (FLUORISHIELD) 1.1 % GEL dental gel PreviDent 5000 Dry Mouth 1.1 % dental paste     Vitamin D, Ergocalciferol, (DRISDOL) 1.25 MG (50000 UNIT) CAPS capsule Take 1 capsule (50,000 Units total) by mouth every 7 (seven) days. 12 capsule 0   No current facility-administered medications for this visit.    ALLERGIES: Shrimp (diagnostic) and Latex  Family History  Adopted: Yes    Review of Systems  PHYSICAL EXAM:  There were no vitals taken for this visit.    General appearance: alert, cooperative and appears stated age Head: normocephalic, without obvious abnormality, atraumatic Neck: no adenopathy, supple, symmetrical, trachea midline and thyroid normal to inspection and palpation Lungs: clear to auscultation bilaterally Breasts: normal appearance, no masses or tenderness, No nipple retraction or dimpling, No nipple discharge or bleeding, No axillary adenopathy Heart: regular rate and rhythm  Abdomen: soft, non-tender; no masses, no organomegaly Extremities: extremities normal, atraumatic, no cyanosis or edema Skin: skin color, texture, turgor normal. No rashes or lesions Lymph nodes: cervical, supraclavicular, and axillary nodes normal. Neurologic: grossly normal  Pelvic: External genitalia:  no lesions              No abnormal inguinal nodes palpated.              Urethra:  normal appearing urethra with no masses, tenderness or lesions              Bartholins and Skenes: normal                 Vagina: normal appearing vagina with normal color and discharge, no lesions              Cervix: no lesions               Pap taken: {yes no:314532} Bimanual Exam:  Uterus:  normal size, contour, position, consistency, mobility, non-tender              Adnexa: no mass, fullness, tenderness              Rectal exam: {yes no:314532}.  Confirms.              Anus:  normal sphincter tone, no lesions  Chaperone was present for exam:  {BSCHAPERONE:31226::"Kaylum Shrum F, CMA"}  ASSESSMENT: Well woman visit with gynecologic exam.  PHQ-9: ***  ***  PLAN: Mammogram screening discussed. Self breast awareness reviewed. Pap and HRV collected:  {yes no:314532} Guidelines for Calcium, Vitamin D, regular exercise program including cardiovascular and weight bearing exercise. Medication refills:  *** {LABS (Optional):23779} Follow up:  ***    Additional counseling given.  {yes [id number]. ***  total time was spent for this patient encounter, including preparation, face-to-face counseling with the patient, coordination of care, and documentation of the encounter in addition to doing the well woman visit with gynecologic exam.

## 2023-11-28 DIAGNOSIS — F332 Major depressive disorder, recurrent severe without psychotic features: Secondary | ICD-10-CM | POA: Diagnosis not present

## 2023-11-30 DIAGNOSIS — F332 Major depressive disorder, recurrent severe without psychotic features: Secondary | ICD-10-CM | POA: Diagnosis not present

## 2023-12-04 DIAGNOSIS — F332 Major depressive disorder, recurrent severe without psychotic features: Secondary | ICD-10-CM | POA: Diagnosis not present

## 2023-12-07 DIAGNOSIS — F332 Major depressive disorder, recurrent severe without psychotic features: Secondary | ICD-10-CM | POA: Diagnosis not present

## 2023-12-11 ENCOUNTER — Ambulatory Visit: Payer: Managed Care, Other (non HMO) | Admitting: Obstetrics and Gynecology

## 2023-12-11 DIAGNOSIS — F332 Major depressive disorder, recurrent severe without psychotic features: Secondary | ICD-10-CM | POA: Diagnosis not present

## 2023-12-14 DIAGNOSIS — F332 Major depressive disorder, recurrent severe without psychotic features: Secondary | ICD-10-CM | POA: Diagnosis not present

## 2023-12-17 DIAGNOSIS — F332 Major depressive disorder, recurrent severe without psychotic features: Secondary | ICD-10-CM | POA: Diagnosis not present

## 2023-12-18 DIAGNOSIS — F332 Major depressive disorder, recurrent severe without psychotic features: Secondary | ICD-10-CM | POA: Diagnosis not present

## 2023-12-25 DIAGNOSIS — F332 Major depressive disorder, recurrent severe without psychotic features: Secondary | ICD-10-CM | POA: Diagnosis not present

## 2023-12-25 DIAGNOSIS — Z1331 Encounter for screening for depression: Secondary | ICD-10-CM | POA: Diagnosis not present

## 2023-12-25 DIAGNOSIS — Z0000 Encounter for general adult medical examination without abnormal findings: Secondary | ICD-10-CM | POA: Diagnosis not present

## 2023-12-25 DIAGNOSIS — Z1339 Encounter for screening examination for other mental health and behavioral disorders: Secondary | ICD-10-CM | POA: Diagnosis not present

## 2023-12-25 DIAGNOSIS — R21 Rash and other nonspecific skin eruption: Secondary | ICD-10-CM | POA: Diagnosis not present

## 2023-12-28 DIAGNOSIS — F332 Major depressive disorder, recurrent severe without psychotic features: Secondary | ICD-10-CM | POA: Diagnosis not present

## 2024-01-01 DIAGNOSIS — F332 Major depressive disorder, recurrent severe without psychotic features: Secondary | ICD-10-CM | POA: Diagnosis not present

## 2024-01-04 DIAGNOSIS — F332 Major depressive disorder, recurrent severe without psychotic features: Secondary | ICD-10-CM | POA: Diagnosis not present

## 2024-01-08 DIAGNOSIS — F332 Major depressive disorder, recurrent severe without psychotic features: Secondary | ICD-10-CM | POA: Diagnosis not present

## 2024-01-11 DIAGNOSIS — F332 Major depressive disorder, recurrent severe without psychotic features: Secondary | ICD-10-CM | POA: Diagnosis not present

## 2024-01-15 DIAGNOSIS — F332 Major depressive disorder, recurrent severe without psychotic features: Secondary | ICD-10-CM | POA: Diagnosis not present

## 2024-01-18 DIAGNOSIS — F332 Major depressive disorder, recurrent severe without psychotic features: Secondary | ICD-10-CM | POA: Diagnosis not present

## 2024-01-22 DIAGNOSIS — F332 Major depressive disorder, recurrent severe without psychotic features: Secondary | ICD-10-CM | POA: Diagnosis not present

## 2024-01-24 DIAGNOSIS — G43709 Chronic migraine without aura, not intractable, without status migrainosus: Secondary | ICD-10-CM | POA: Diagnosis not present

## 2024-01-29 DIAGNOSIS — F332 Major depressive disorder, recurrent severe without psychotic features: Secondary | ICD-10-CM | POA: Diagnosis not present

## 2024-01-30 NOTE — Progress Notes (Unsigned)
 New Patient Note  RE: [name] MRN: [id number] DOB: May 01, 2001 Date of Office Visit: 01/31/2024  Consult requested by: Azalia Leo, MD Primary care provider: Azalia Leo, MD  Chief Complaint: Rash and Establish Care (Adopted:Unexplained rash with foods and sleep. Shrimp allergy takes benadryl. Has migraines)  History of Present Illness: I had the pleasure of seeing [name] for initial evaluation at the [facility] on 01/31/2024. She is a 23 y.o. female, who is referred here by Azalia Leo, MD for the evaluation of rash.  She is accompanied today by her mother who provided/contributed to the history.   Discussed the use of AI scribe software for clinical note transcription with the patient, who gave verbal consent to proceed.    She has been experiencing recurrent hives for the past one to two months. The hives present as red, slightly raised bumps that initially appear on her legs and then spread to her arms and sometimes her neck. Each episode lasts about ten minutes. The hives occur both during and outside of meal times, including when she is asleep, and have been occurring intermittently, with periods of daily episodes followed by weeks without symptoms. No specific triggers have been identified, and there have been no changes in her diet, medications, or personal care products. Benadryl and Benadryl spray have been ineffective in providing relief. She has not received any steroids for this issue and has not undergone any prior workup specifically for her hives.  She has a history of gastroparesis, migraines, acid reflux, and polycystic ovary syndrome (PCOS). She was previously on medication for gastroparesis but had to discontinue it due to insurance changes and side effects. She experiences nausea and throat tightness specifically when consuming shrimp, leading her to avoid shellfish. No other food allergies, environmental or seasonal allergies, asthma,  or frequent infections.   No recent fevers, chills, weight loss, or changes in appetite or bowel habits. Breathing is reported as good.     Rash started about 1-2 months ago. Mainly occurs on her arms, legs, neck. Describes them as itchy, raised, red. Individual rashes lasts about 10 min. Associated symptoms include: none.  Frequency of episodes: depends Suspected triggers are unknown. Denies any fevers, chills, changes in medications, foods, personal care products or recent infections. She has tried the following therapies: benadryl prn with no benefit. Systemic steroids: no.  Previous work up includes: no. Previous history of rash/hives: no.  Dietary History: patient has been eating other foods including milk, limited eggs, peanut, treenuts, sesame, limited fish, soy, wheat, meats, fruits and vegetables.  She reports reading labels and avoiding shellfish in diet completely.   Assessment and Plan: [name] is a 23 y.o. female with: Urticaria Intermittent hives for 1-2 months, no clear trigger. Possible shellfish association. Benadryl ineffective.  Keep track of rashes and take pictures. See below for proper skin care. Use fragrance free and dye free products. No dryer sheets or fabric softener.   Start zyrtec (cetirizine) 10mg  once a day at night and may take twice a day. If symptoms are not controlled or causes drowsiness let us  know. May take Pepcid  (famotidine ) 20mg  twice a day.  Avoid the following potential triggers: alcohol, tight clothing, NSAIDs, hot showers and getting overheated. See below for proper skin care.  Get bloodwork.  Other adverse food reactions, not elsewhere classified, subsequent encounter Discussed with patient and mother that skin prick testing and bloodwork (food IgE levels) checks for IgE mediated reactions. Keep  a food journal with symptoms and foods eaten. Get bloodwork. Avoid shellfish. For mild symptoms you can take over the counter antihistamines  such as zyrtec  10mg  and monitor symptoms closely.  If symptoms worsen or if you have severe symptoms including breathing issues, throat closure, significant swelling, whole body hives, severe diarrhea and vomiting, lightheadedness then seek immediate medical care.  Gastroesophageal reflux disease, unspecified whether esophagitis present See handout for lifestyle and dietary modifications. Continue pantoprazole 40mg  once day - nothing to eat or drink for 20-30 minutes afterwards.   Return in about 4 weeks (around 02/28/2024).  Meds ordered this encounter  Medications   cetirizine  (ZYRTEC  ALLERGY) 10 MG tablet    Sig: Take 1 tablet (10 mg total) by mouth daily.    Dispense:  30 tablet    Refill:  2   famotidine  (PEPCID ) 20 MG tablet    Sig: Take 1 tablet (20 mg total) by mouth 2 (two) times daily.    Dispense:  60 tablet    Refill:  2   Lab Orders         Alpha-Gal Panel         CBC with Differential/Platelet         Chronic Urticaria         Comprehensive metabolic panel with GFR         C-reactive protein         Tryptase         Thyroid Cascade Profile         Sedimentation rate         ANA, IFA (with reflex)         Allergen Profile, Shellfish      Other allergy screening: Asthma: no Rhino conjunctivitis: no Medication allergy: no Hymenoptera allergy: no History of recurrent infections suggestive of immunodeficency: no  Diagnostics: None.   Past Medical History: Patient Active Problem List   Diagnosis Date Noted   Diabetes mellitus (HCC) 06/21/2022   Vitamin D  deficiency 06/21/2022   Other hyperlipidemia 06/21/2022   Class 2 severe obesity with serious comorbidity and body mass index (BMI) of 39.0 to 39.9 in adult (HCC) 06/21/2022   Insulin  resistance 06/21/2022   Side pain 06/21/2022   Chronic daily headache 01/12/2021   Anxiety 06/04/2020   Depressive disorder 06/04/2020   Abdominal pain 07/17/2018   NAFLD (nonalcoholic fatty liver disease) 16/06/9603   S/P  nail surgery 01/27/2016   Ingrown toenail 01/20/2016   PCOS (polycystic ovarian syndrome) 10/08/2014   Tension headache 01/20/2013   Migraine without aura and without status migrainosus, not intractable 01/20/2013   ADD (attention deficit disorder) 01/27/2012   Past Medical History:  Diagnosis Date   Acquired primary ovarian hypogonadism    ADD (attention deficit disorder)    Amenorrhea    when cycles first started age 72   Anxiety    Depression    Elevated prolactin level 09/16/2018   Taking Pamelor.   Gallbladder problem    Headache(784.0)    migraine without aura   IBS (irritable bowel syndrome)    PCOS (polycystic ovarian syndrome)    PCOS (polycystic ovarian syndrome)    Past Surgical History: Past Surgical History:  Procedure Laterality Date   CHOLECYSTECTOMY     CHOLECYSTECTOMY  2018   Medication List:  Current Outpatient Medications  Medication Sig Dispense Refill   Acetaminophen  (TYLENOL  PO) Take 1 tablet by mouth daily as needed (pain). Fast acting     cetirizine  (ZYRTEC  ALLERGY) 10 MG tablet  Take 1 tablet (10 mg total) by mouth daily. 30 tablet 2   desvenlafaxine (PRISTIQ) 50 MG 24 hr tablet Take 50 mg by mouth daily.     drospirenone -ethinyl estradiol  (SYEDA ) 3-0.03 MG tablet Take 1 tablet by mouth daily. 84 tablet 1   famotidine  (PEPCID ) 20 MG tablet Take 1 tablet (20 mg total) by mouth 2 (two) times daily. 60 tablet 2   Fremanezumab-vfrm (AJOVY) 225 MG/1.5ML SOAJ Inject into the skin.     gabapentin (NEURONTIN) 300 MG capsule Take 300 mg by mouth 2 (two) times daily.     hydrOXYzine (VISTARIL) 25 MG capsule Take 25 mg by mouth daily.     metroNIDAZOLE  (FLAGYL ) 500 MG tablet Take 1 tablet (500 mg total) by mouth 2 (two) times daily. Take twice daily for one week. 14 tablet 0   pantoprazole (PROTONIX) 40 MG tablet Take 40 mg by mouth daily.     sodium fluoride (FLUORISHIELD) 1.1 % GEL dental gel PreviDent 5000 Dry Mouth 1.1 % dental paste     Vitamin D ,  Ergocalciferol , (DRISDOL ) 1.25 MG (50000 UNIT) CAPS capsule Take 1 capsule (50,000 Units total) by mouth every 7 (seven) days. 12 capsule 0   No current facility-administered medications for this visit.   Allergies: Allergies  Allergen Reactions   Shrimp (Diagnostic) Anaphylaxis   Latex Itching and Swelling    Burning    Social History: Social History   Socioeconomic History   Marital status: Single    Spouse name: Not on file   Number of children: Not on file   Years of education: Not on file   Highest education level: Not on file  Occupational History   Occupation: Student  Tobacco Use   Smoking status: Never   Smokeless tobacco: Never  Vaping Use   Vaping status: Former  Substance and Sexual Activity   Alcohol use: No   Drug use: No   Sexual activity: Never    Birth control/protection: Pill, Abstinence  Other Topics Concern   Not on file  Social History Narrative   Spends time between father and boarding school that she lives at. Address is father's    Right handed   Caffeine : 1 cup/day   Social Drivers of Corporate investment banker Strain: Not on file  Food Insecurity: Not on file  Transportation Needs: Not on file  Physical Activity: Not on file  Stress: Not on file  Social Connections: Not on file   Lives in a house. Smoking: denies Occupation: not employed  Environmental HistorySurveyor, minerals in the house: no Carpet in the family room: no Carpet in the bedroom: no Heating: gas Cooling: central Pet: yes 1 dog, 1 cat at [facility]. 3 dogs at dad's house  Family History: Family History  Adopted: Yes   Review of Systems  Constitutional:  Negative for appetite change, chills, fever and unexpected weight change.  HENT:  Negative for congestion and rhinorrhea.   Eyes:  Negative for itching.  Respiratory:  Negative for cough, chest tightness, shortness of breath and wheezing.   Cardiovascular:  Negative for chest pain.  Gastrointestinal:   Negative for abdominal pain.  Genitourinary:  Negative for difficulty urinating.  Skin:  Positive for rash.  Neurological:  Negative for headaches.    Objective: BP 126/70   Pulse (!) 105   Temp 98.1 F (36.7 C) (Temporal)   Resp 20   Ht 5\' 2"  (1.575 m)   Wt 214 lb 12.8 oz (97.4 kg)  SpO2 98%   BMI 39.29 kg/m  Body mass index is 39.29 kg/m. Physical Exam Vitals and nursing note reviewed.  Constitutional:      Appearance: Normal appearance. She is well-developed.  HENT:     Head: Normocephalic and atraumatic.     Right Ear: Tympanic membrane and external ear normal.     Left Ear: Tympanic membrane and external ear normal.     Nose: Nose normal.     Mouth/Throat:     Mouth: Mucous membranes are moist.     Pharynx: Oropharynx is clear.  Eyes:     Conjunctiva/sclera: Conjunctivae normal.  Cardiovascular:     Rate and Rhythm: Normal rate and regular rhythm.     Heart sounds: Normal heart sounds. No murmur heard.    No friction rub. No gallop.  Pulmonary:     Effort: Pulmonary effort is normal.     Breath sounds: Normal breath sounds. No wheezing, rhonchi or rales.  Musculoskeletal:     Cervical back: Neck supple.  Skin:    General: Skin is warm.     Findings: No rash.  Neurological:     Mental Status: She is alert and oriented to person, place, and time.  Psychiatric:        Behavior: Behavior normal.   The plan was reviewed with the patient/family, and all questions/concerned were addressed.  It was my pleasure to see [name] today and participate in her care. Please feel free to contact me with any questions or concerns.  Sincerely,  Eudelia Hero, DO Allergy & Immunology  [facility]  [facility] office: [phone] [facility] office: [phone]

## 2024-01-31 ENCOUNTER — Encounter: Payer: Self-pay | Admitting: Allergy

## 2024-01-31 ENCOUNTER — Other Ambulatory Visit: Payer: Self-pay

## 2024-01-31 ENCOUNTER — Ambulatory Visit: Admitting: Allergy

## 2024-01-31 VITALS — BP 126/70 | HR 105 | Temp 98.1°F | Resp 20 | Ht 62.0 in | Wt 214.8 lb

## 2024-01-31 DIAGNOSIS — K219 Gastro-esophageal reflux disease without esophagitis: Secondary | ICD-10-CM

## 2024-01-31 DIAGNOSIS — L509 Urticaria, unspecified: Secondary | ICD-10-CM

## 2024-01-31 DIAGNOSIS — T781XXD Other adverse food reactions, not elsewhere classified, subsequent encounter: Secondary | ICD-10-CM | POA: Diagnosis not present

## 2024-01-31 MED ORDER — FAMOTIDINE 20 MG PO TABS
20.0000 mg | ORAL_TABLET | Freq: Two times a day (BID) | ORAL | 2 refills | Status: AC
Start: 2024-01-31 — End: ?

## 2024-01-31 MED ORDER — CETIRIZINE HCL 10 MG PO TABS: 10.0000 mg | ORAL_TABLET | Freq: Every day | ORAL | 2 refills | Status: DC

## 2024-01-31 NOTE — Patient Instructions (Addendum)
 Skin  Keep track of rashes and take pictures. See below for proper skin care. Use fragrance free and dye free products. No dryer sheets or fabric softener.    Start zyrtec (cetirizine) 10mg  once a day at night and may take twice a day. If symptoms are not controlled or causes drowsiness let us  know. May take Pepcid  (famotidine ) 20mg  twice a day.  Avoid the following potential triggers: alcohol, tight clothing, NSAIDs, hot showers and getting overheated. See below for proper skin care.   Get bloodwork. We are ordering labs, so please allow 1-2 weeks for the results to come back. With the newly implemented Cures Act, the labs might be visible to you at the same time that they become visible to me. However, I will not address the results until all of the results are back, so please be patient.   Food: Discussed with patient and mother that skin prick testing and bloodwork (food IgE levels) checks for IgE mediated reactions.Aaron Aas Keep a food journal with symptoms and foods eaten. Get bloodwork. Avoid shellfish. For mild symptoms you can take over the counter antihistamines such as zyrtec 10mg  and monitor symptoms closely.  If symptoms worsen or if you have severe symptoms including breathing issues, throat closure, significant swelling, whole body hives, severe diarrhea and vomiting, lightheadedness then seek immediate medical care.  Allergy: food allergy is when you have eaten a food, developed an allergic reaction after eating the food and have IgE to the food (positive food testing either by skin testing or blood testing).  Food allergy could lead to life threatening symptoms Sensitivity: occurs when you have IgE to a food (positive food testing either by skin testing or blood testing) but is a food you eat without any issues.  This is not an allergy and we recommend keeping the food in the diet Intolerance: this is when you have negative testing by either skin testing or blood testing thus not  allergic but the food causes symptoms (like belly pain, bloating, diarrhea etc) with ingestion.  These foods should be avoided to prevent symptoms.     Reflux See handout for lifestyle and dietary modifications. Continue pantoprazole 40mg  once day - nothing to eat or drink for 20-30 minutes afterwards.   Return in about 4 weeks (around 02/28/2024). Or sooner if needed.  Skin care recommendations  Bath time: Always use lukewarm water. AVOID very hot or cold water. Keep bathing time to 5-10 minutes. Do NOT use bubble bath. Use a mild soap and use just enough to wash the dirty areas. Do NOT scrub skin vigorously.  After bathing, pat dry your skin with a towel. Do NOT rub or scrub the skin.  Moisturizers and prescriptions:  ALWAYS apply moisturizers immediately after bathing (within 3 minutes). This helps to lock-in moisture. Use the moisturizer several times a day over the whole body. Good summer moisturizers include: Aveeno, CeraVe, Cetaphil. Good winter moisturizers include: Aquaphor, Vaseline, Cerave, Cetaphil, Eucerin, Vanicream. When using moisturizers along with medications, the moisturizer should be applied about one hour after applying the medication to prevent diluting effect of the medication or moisturize around where you applied the medications. When not using medications, the moisturizer can be continued twice daily as maintenance.  Laundry and clothing: Avoid laundry products with added color or perfumes. Use unscented hypo-allergenic laundry products such as Tide free, Cheer free & gentle, and All free and clear.  If the skin still seems dry or sensitive, you can try double-rinsing the clothes. Avoid tight  or scratchy clothing such as wool. Do not use fabric softeners or dyer sheets.

## 2024-02-01 DIAGNOSIS — F332 Major depressive disorder, recurrent severe without psychotic features: Secondary | ICD-10-CM | POA: Diagnosis not present

## 2024-02-05 DIAGNOSIS — F332 Major depressive disorder, recurrent severe without psychotic features: Secondary | ICD-10-CM | POA: Diagnosis not present

## 2024-02-07 DIAGNOSIS — T781XXD Other adverse food reactions, not elsewhere classified, subsequent encounter: Secondary | ICD-10-CM | POA: Diagnosis not present

## 2024-02-07 DIAGNOSIS — L509 Urticaria, unspecified: Secondary | ICD-10-CM | POA: Diagnosis not present

## 2024-02-07 DIAGNOSIS — F332 Major depressive disorder, recurrent severe without psychotic features: Secondary | ICD-10-CM | POA: Diagnosis not present

## 2024-02-12 DIAGNOSIS — F332 Major depressive disorder, recurrent severe without psychotic features: Secondary | ICD-10-CM | POA: Diagnosis not present

## 2024-02-13 LAB — COMPREHENSIVE METABOLIC PANEL WITH GFR
ALT: 13 IU/L (ref 0–32)
AST: 17 IU/L (ref 0–40)
Albumin: 3.9 g/dL — ABNORMAL LOW (ref 4.0–5.0)
Alkaline Phosphatase: 74 IU/L (ref 44–121)
BUN/Creatinine Ratio: 11 (ref 9–23)
BUN: 7 mg/dL (ref 6–20)
Bilirubin Total: <0.2 mg/dL (ref 0.0–1.2)
CO2: 20 mmol/L (ref 20–29)
Calcium: 9 mg/dL (ref 8.7–10.2)
Chloride: 104 mmol/L (ref 96–106)
Creatinine, Ser: 0.62 mg/dL (ref 0.57–1.00)
Globulin, Total: 3 g/dL (ref 1.5–4.5)
Glucose: 90 mg/dL (ref 70–99)
Potassium: 4.1 mmol/L (ref 3.5–5.2)
Sodium: 141 mmol/L (ref 134–144)
Total Protein: 6.9 g/dL (ref 6.0–8.5)
eGFR: 129 mL/min/{1.73_m2} (ref >59)

## 2024-02-13 LAB — ANTINUCLEAR ANTIBODIES, IFA: ANA Titer 1: POSITIVE — AB

## 2024-02-13 LAB — CBC WITH DIFFERENTIAL/PLATELET
Basophils Absolute: 0 10*3/uL (ref 0.0–0.2)
Basos: 0 %
EOS (ABSOLUTE): 0.2 10*3/uL (ref 0.0–0.4)
Eos: 2 %
Hematocrit: 37 % (ref 34.0–46.6)
Hemoglobin: 11.7 g/dL (ref 11.1–15.9)
Immature Grans (Abs): 0 10*3/uL (ref 0.0–0.1)
Immature Granulocytes: 0 %
Lymphocytes Absolute: 4.4 10*3/uL — ABNORMAL HIGH (ref 0.7–3.1)
Lymphs: 42 %
MCH: 25.5 pg — ABNORMAL LOW (ref 26.6–33.0)
MCHC: 31.6 g/dL (ref 31.5–35.7)
MCV: 81 fL (ref 79–97)
Monocytes Absolute: 0.7 10*3/uL (ref 0.1–0.9)
Monocytes: 6 %
Neutrophils Absolute: 5.2 10*3/uL (ref 1.4–7.0)
Neutrophils: 50 %
Platelets: 411 10*3/uL (ref 150–450)
RBC: 4.58 x10E6/uL (ref 3.77–5.28)
RDW: 14 % (ref 11.7–15.4)
WBC: 10.6 10*3/uL (ref 3.4–10.8)

## 2024-02-13 LAB — THYROID CASCADE PROFILE: TSH: 1.38 u[IU]/mL (ref 0.450–4.500)

## 2024-02-13 LAB — C-REACTIVE PROTEIN: CRP: 5 mg/L (ref 0–10)

## 2024-02-13 LAB — FANA STAINING PATTERNS: Speckled Pattern: 1:320 {titer} — ABNORMAL HIGH

## 2024-02-13 LAB — ALLERGEN PROFILE, SHELLFISH
Clam IgE: <0.1 kU/L
F023-IgE Crab: <0.1 kU/L
F080-IgE Lobster: <0.1 kU/L
F290-IgE Oyster: <0.1 kU/L
Scallop IgE: <0.1 kU/L
Shrimp IgE: <0.1 kU/L

## 2024-02-13 LAB — ALPHA-GAL PANEL
Allergen Lamb IgE: <0.1 kU/L
Beef IgE: <0.1 kU/L
IgE (Immunoglobulin E), Serum: 63 [IU]/mL (ref 6–495)
O215-IgE Alpha-Gal: <0.1 kU/L
Pork IgE: <0.1 kU/L

## 2024-02-13 LAB — AMYLASE: Amylase: 46 U/L (ref 31–110)

## 2024-02-13 LAB — SEDIMENTATION RATE: Sed Rate: 25 mm/h (ref 0–32)

## 2024-02-13 LAB — TRYPTASE: Tryptase: 1.5 ug/L — ABNORMAL LOW (ref 2.2–13.2)

## 2024-02-15 ENCOUNTER — Ambulatory Visit: Payer: Self-pay | Admitting: Allergy

## 2024-02-15 DIAGNOSIS — F332 Major depressive disorder, recurrent severe without psychotic features: Secondary | ICD-10-CM | POA: Diagnosis not present

## 2024-02-19 DIAGNOSIS — F332 Major depressive disorder, recurrent severe without psychotic features: Secondary | ICD-10-CM | POA: Diagnosis not present

## 2024-02-22 DIAGNOSIS — F332 Major depressive disorder, recurrent severe without psychotic features: Secondary | ICD-10-CM | POA: Diagnosis not present

## 2024-02-28 ENCOUNTER — Ambulatory Visit: Admitting: Allergy

## 2024-03-04 DIAGNOSIS — F332 Major depressive disorder, recurrent severe without psychotic features: Secondary | ICD-10-CM | POA: Diagnosis not present

## 2024-03-05 DIAGNOSIS — F332 Major depressive disorder, recurrent severe without psychotic features: Secondary | ICD-10-CM | POA: Diagnosis not present

## 2024-03-05 DIAGNOSIS — L988 Other specified disorders of the skin and subcutaneous tissue: Secondary | ICD-10-CM | POA: Diagnosis not present

## 2024-03-05 NOTE — Progress Notes (Unsigned)
 Follow Up Note  RE: [name] MRN: [id number] DOB: Aug 04, 2001 Date of Office Visit: 03/06/2024  Referring provider: Stephane Leita DEL, MD Primary care provider: Stephane Leita DEL, MD  Chief Complaint: No chief complaint on file.  History of Present Illness: I had the pleasure of seeing [name] for a follow up visit at the [facility] on 03/06/2024. [name] is a 23 y.o. female, who is being followed for urticaria, adverse reaction, GERD. [name] previous allergy office visit was on 01/31/2024 with Dr. Luke. Today is a regular follow up visit.  Discussed the use of AI scribe software for clinical note transcription with the patient, who gave verbal consent to proceed.  History of Present Illness            2025 labs: Blood count, kidney function, liver function, electrolytes, thyroid , inflammation markers, tryptase (checks for mast cell issues) and alpha gal (checks for red meat allergy) were all normal which is great.    Shellfish panel was negative. We can discuss at next visit regarding reintroduction.    Normal amylase.    Your autoimmune screener was positive with 1:320 speckled pattern. I recommend rheumatology evaluation for further work up for this.   Assessment and Plan: [name] is a 23 y.o. female with: Urticaria Intermittent hives for 1-2 months, no clear trigger. Possible shellfish association. Benadryl ineffective.  Keep track of rashes and take pictures. See below for proper skin care. Use fragrance free and dye free products. No dryer sheets or fabric softener.   Start zyrtec  (cetirizine ) 10mg  once a day at night and may take twice a day. If symptoms are not controlled or causes drowsiness let us  know. May take Pepcid  (famotidine ) 20mg  twice a day.  Avoid the following potential triggers: alcohol, tight clothing, NSAIDs, hot showers and getting overheated. See below for proper skin care.  Get bloodwork.   Other adverse food reactions, not  elsewhere classified, subsequent encounter Discussed with patient and mother that skin prick testing and bloodwork (food IgE levels) checks for IgE mediated reactions. Keep a food journal with symptoms and foods eaten. Get bloodwork. Avoid shellfish. For mild symptoms you can take over the counter antihistamines such as zyrtec  10mg  and monitor symptoms closely.  If symptoms worsen or if you have severe symptoms including breathing issues, throat closure, significant swelling, whole body hives, severe diarrhea and vomiting, lightheadedness then seek immediate medical care.   Gastroesophageal reflux disease, unspecified whether esophagitis present See handout for lifestyle and dietary modifications. Continue pantoprazole 40mg  once day - nothing to eat or drink for 20-30 minutes afterwards.  Assessment and Plan              No follow-ups on file.  No orders of the defined types were placed in this encounter.  Lab Orders  No laboratory test(s) ordered today    Diagnostics: Spirometry:  Tracings reviewed. [name] effort: {Blank single:19197::Good reproducible efforts.,It was hard to get consistent efforts and there is a question as to whether this reflects a maximal maneuver.,Poor effort, data can not be interpreted.} FVC: ***L FEV1: ***L, ***% predicted FEV1/FVC ratio: ***% Interpretation: {Blank single:19197::Spirometry consistent with mild obstructive disease,Spirometry consistent with moderate obstructive disease,Spirometry consistent with severe obstructive disease,Spirometry consistent with possible restrictive disease,Spirometry consistent with mixed obstructive and restrictive disease,Spirometry uninterpretable due to technique,Spirometry consistent with normal pattern,No overt abnormalities noted given today's efforts}.  Please see scanned spirometry results for details.  Skin Testing: {Blank single:19197::Select foods,Environmental allergy  panel,Environmental allergy  panel and select foods,Food allergy panel,None,Deferred due to recent antihistamines use}. *** Results discussed with patient/family.   Medication List:  Current Outpatient Medications  Medication Sig Dispense Refill   Acetaminophen  (TYLENOL  PO) Take 1 tablet by mouth daily as needed (pain). Fast acting     cetirizine  (ZYRTEC  ALLERGY) 10 MG tablet Take 1 tablet (10 mg total) by mouth daily. 30 tablet 2   desvenlafaxine (PRISTIQ) 50 MG 24 hr tablet Take 50 mg by mouth daily.     drospirenone -ethinyl estradiol  (SYEDA ) 3-0.03 MG tablet Take 1 tablet by mouth daily. 84 tablet 1   famotidine  (PEPCID ) 20 MG tablet Take 1 tablet (20 mg total) by mouth 2 (two) times daily. 60 tablet 2   Fremanezumab-vfrm (AJOVY) 225 MG/1.5ML SOAJ Inject into the skin.     gabapentin (NEURONTIN) 300 MG capsule Take 300 mg by mouth 2 (two) times daily.     hydrOXYzine (VISTARIL) 25 MG capsule Take 25 mg by mouth daily.     metroNIDAZOLE  (FLAGYL ) 500 MG tablet Take 1 tablet (500 mg total) by mouth 2 (two) times daily. Take twice daily for one week. 14 tablet 0   pantoprazole (PROTONIX) 40 MG tablet Take 40 mg by mouth daily.     sodium fluoride (FLUORISHIELD) 1.1 % GEL dental gel PreviDent 5000 Dry Mouth 1.1 % dental paste     Vitamin D , Ergocalciferol , (DRISDOL ) 1.25 MG (50000 UNIT) CAPS capsule Take 1 capsule (50,000 Units total) by mouth every 7 (seven) days. 12 capsule 0   No current facility-administered medications for this visit.   Allergies: Allergies  Allergen Reactions   Shrimp (Diagnostic) Anaphylaxis   Latex Itching and Swelling    Burning    I reviewed [name] past medical history, social history, family history, and environmental history and no significant changes have been reported from [name] previous visit.  Review of Systems  Constitutional:  Negative for appetite change, chills, fever and unexpected weight change.  HENT:  Negative for congestion and rhinorrhea.    Eyes:  Negative for itching.  Respiratory:  Negative for cough, chest tightness, shortness of breath and wheezing.   Cardiovascular:  Negative for chest pain.  Gastrointestinal:  Negative for abdominal pain.  Genitourinary:  Negative for difficulty urinating.  Skin:  Positive for rash.  Neurological:  Negative for headaches.    Objective: There were no vitals taken for this visit. There is no height or weight on file to calculate BMI. Physical Exam Vitals and nursing note reviewed.  Constitutional:      Appearance: Normal appearance. [name] is well-developed.  HENT:     Head: Normocephalic and atraumatic.     Right Ear: Tympanic membrane and external ear normal.     Left Ear: Tympanic membrane and external ear normal.     Nose: Nose normal.     Mouth/Throat:     Mouth: Mucous membranes are moist.     Pharynx: Oropharynx is clear.  Eyes:     Conjunctiva/sclera: Conjunctivae normal.  Cardiovascular:     Rate and Rhythm: Normal rate and regular rhythm.     Heart sounds: Normal heart sounds. No murmur heard.    No friction rub. No gallop.  Pulmonary:     Effort: Pulmonary effort is normal.     Breath sounds: Normal breath sounds. No wheezing, rhonchi or rales.  Musculoskeletal:     Cervical back: Neck supple.  Skin:    General: Skin is warm.     Findings: No rash.  Neurological:  Mental Status: [name] is alert and oriented to person, place, and time.  Psychiatric:        Behavior: Behavior normal.    Previous notes and tests were reviewed. The plan was reviewed with the patient/family, and all questions/concerned were addressed.  It was my pleasure to see [name] today and participate in [name] care. Please feel free to contact me with any questions or concerns.  Sincerely,  Orlan Cramp, DO Allergy & Immunology  [facility]  [place] office: [phone] [facility] office: [phone]

## 2024-03-06 ENCOUNTER — Other Ambulatory Visit: Payer: Self-pay

## 2024-03-06 ENCOUNTER — Telehealth: Payer: Self-pay | Admitting: Allergy

## 2024-03-06 ENCOUNTER — Ambulatory Visit: Admitting: Allergy

## 2024-03-06 ENCOUNTER — Encounter: Payer: Self-pay | Admitting: Allergy

## 2024-03-06 VITALS — BP 114/72 | HR 114 | Temp 98.1°F | Resp 20 | Wt 214.0 lb

## 2024-03-06 DIAGNOSIS — T781XXD Other adverse food reactions, not elsewhere classified, subsequent encounter: Secondary | ICD-10-CM

## 2024-03-06 DIAGNOSIS — K219 Gastro-esophageal reflux disease without esophagitis: Secondary | ICD-10-CM | POA: Diagnosis not present

## 2024-03-06 DIAGNOSIS — L509 Urticaria, unspecified: Secondary | ICD-10-CM

## 2024-03-06 NOTE — Patient Instructions (Addendum)
 Skin  Keep track of rashes and take pictures. Continue skin care. Continue zyrtec  (cetirizine ) 10mg  once a day at night and may take twice a day. If symptoms are not controlled or causes drowsiness let us  know. May take Pepcid  (famotidine ) 20mg  twice a day.  Avoid the following potential triggers: alcohol, tight clothing, NSAIDs, hot showers and getting overheated. Refer to rheumatology for elevated ANA 1:320 speckled pattern to rule out any rheumatological conditions.  Food: Discussed with patient and mother that skin prick testing and bloodwork (food IgE levels) checks for IgE mediated reactions.SABRA Keep a food journal with symptoms and foods eaten. Avoid shellfish. For mild symptoms you can take over the counter antihistamines such as zyrtec  10mg  and monitor symptoms closely.  If symptoms worsen or if you have severe symptoms including breathing issues, throat closure, significant swelling, whole body hives, severe diarrhea and vomiting, lightheadedness then seek immediate medical care.  Allergy: food allergy is when you have eaten a food, developed an allergic reaction after eating the food and have IgE to the food (positive food testing either by skin testing or blood testing).  Food allergy could lead to life threatening symptoms Sensitivity: occurs when you have IgE to a food (positive food testing either by skin testing or blood testing) but is a food you eat without any issues.  This is not an allergy and we recommend keeping the food in the diet Intolerance: this is when you have negative testing by either skin testing or blood testing thus not allergic but the food causes symptoms (like belly pain, bloating, diarrhea etc) with ingestion.  These foods should be avoided to prevent symptoms.     Reflux Continue lifestyle and dietary modifications. Continue pantoprazole 40mg  once day - nothing to eat or drink for 20-30 minutes afterwards.   Return in about 4 months (around 07/07/2024). Or  sooner if needed.  Skin care recommendations  Bath time: Always use lukewarm water. AVOID very hot or cold water. Keep bathing time to 5-10 minutes. Do NOT use bubble bath. Use a mild soap and use just enough to wash the dirty areas. Do NOT scrub skin vigorously.  After bathing, pat dry your skin with a towel. Do NOT rub or scrub the skin.  Moisturizers and prescriptions:  ALWAYS apply moisturizers immediately after bathing (within 3 minutes). This helps to lock-in moisture. Use the moisturizer several times a day over the whole body. Good summer moisturizers include: Aveeno, CeraVe, Cetaphil. Good winter moisturizers include: Aquaphor, Vaseline, Cerave, Cetaphil, Eucerin, Vanicream. When using moisturizers along with medications, the moisturizer should be applied about one hour after applying the medication to prevent diluting effect of the medication or moisturize around where you applied the medications. When not using medications, the moisturizer can be continued twice daily as maintenance.  Laundry and clothing: Avoid laundry products with added color or perfumes. Use unscented hypo-allergenic laundry products such as Tide free, Cheer free & gentle, and All free and clear.  If the skin still seems dry or sensitive, you can try double-rinsing the clothes. Avoid tight or scratchy clothing such as wool. Do not use fabric softeners or dyer sheets.

## 2024-03-06 NOTE — Telephone Encounter (Signed)
 Refer to rheumatology for elevated ANA 1:320 speckled pattern. Thank you.

## 2024-03-11 DIAGNOSIS — F332 Major depressive disorder, recurrent severe without psychotic features: Secondary | ICD-10-CM | POA: Diagnosis not present

## 2024-03-13 DIAGNOSIS — L988 Other specified disorders of the skin and subcutaneous tissue: Secondary | ICD-10-CM | POA: Diagnosis not present

## 2024-03-14 DIAGNOSIS — F332 Major depressive disorder, recurrent severe without psychotic features: Secondary | ICD-10-CM | POA: Diagnosis not present

## 2024-03-17 DIAGNOSIS — F4323 Adjustment disorder with mixed anxiety and depressed mood: Secondary | ICD-10-CM | POA: Diagnosis not present

## 2024-03-18 DIAGNOSIS — F332 Major depressive disorder, recurrent severe without psychotic features: Secondary | ICD-10-CM | POA: Diagnosis not present

## 2024-03-21 DIAGNOSIS — F332 Major depressive disorder, recurrent severe without psychotic features: Secondary | ICD-10-CM | POA: Diagnosis not present

## 2024-03-24 DIAGNOSIS — F4323 Adjustment disorder with mixed anxiety and depressed mood: Secondary | ICD-10-CM | POA: Diagnosis not present

## 2024-03-25 DIAGNOSIS — F332 Major depressive disorder, recurrent severe without psychotic features: Secondary | ICD-10-CM | POA: Diagnosis not present

## 2024-03-26 DIAGNOSIS — F332 Major depressive disorder, recurrent severe without psychotic features: Secondary | ICD-10-CM | POA: Diagnosis not present

## 2024-03-26 NOTE — Telephone Encounter (Signed)
 [name] has been scheduled for 06/05/24 at 2:00 pm with Dr. Jayme

## 2024-03-31 DIAGNOSIS — F332 Major depressive disorder, recurrent severe without psychotic features: Secondary | ICD-10-CM | POA: Diagnosis not present

## 2024-04-08 DIAGNOSIS — F332 Major depressive disorder, recurrent severe without psychotic features: Secondary | ICD-10-CM | POA: Diagnosis not present

## 2024-04-10 DIAGNOSIS — F332 Major depressive disorder, recurrent severe without psychotic features: Secondary | ICD-10-CM | POA: Diagnosis not present

## 2024-04-11 DIAGNOSIS — K581 Irritable bowel syndrome with constipation: Secondary | ICD-10-CM | POA: Diagnosis not present

## 2024-04-11 DIAGNOSIS — K3184 Gastroparesis: Secondary | ICD-10-CM | POA: Diagnosis not present

## 2024-04-11 DIAGNOSIS — K219 Gastro-esophageal reflux disease without esophagitis: Secondary | ICD-10-CM | POA: Diagnosis not present

## 2024-04-14 DIAGNOSIS — F4323 Adjustment disorder with mixed anxiety and depressed mood: Secondary | ICD-10-CM | POA: Diagnosis not present

## 2024-04-16 DIAGNOSIS — F332 Major depressive disorder, recurrent severe without psychotic features: Secondary | ICD-10-CM | POA: Diagnosis not present

## 2024-04-17 DIAGNOSIS — G43709 Chronic migraine without aura, not intractable, without status migrainosus: Secondary | ICD-10-CM | POA: Diagnosis not present

## 2024-04-22 DIAGNOSIS — F332 Major depressive disorder, recurrent severe without psychotic features: Secondary | ICD-10-CM | POA: Diagnosis not present

## 2024-04-25 DIAGNOSIS — F332 Major depressive disorder, recurrent severe without psychotic features: Secondary | ICD-10-CM | POA: Diagnosis not present

## 2024-04-28 DIAGNOSIS — F4323 Adjustment disorder with mixed anxiety and depressed mood: Secondary | ICD-10-CM | POA: Diagnosis not present

## 2024-05-06 DIAGNOSIS — F332 Major depressive disorder, recurrent severe without psychotic features: Secondary | ICD-10-CM | POA: Diagnosis not present

## 2024-05-09 DIAGNOSIS — F332 Major depressive disorder, recurrent severe without psychotic features: Secondary | ICD-10-CM | POA: Diagnosis not present

## 2024-05-12 DIAGNOSIS — F4323 Adjustment disorder with mixed anxiety and depressed mood: Secondary | ICD-10-CM | POA: Diagnosis not present

## 2024-05-13 DIAGNOSIS — F332 Major depressive disorder, recurrent severe without psychotic features: Secondary | ICD-10-CM | POA: Diagnosis not present

## 2024-05-16 DIAGNOSIS — F332 Major depressive disorder, recurrent severe without psychotic features: Secondary | ICD-10-CM | POA: Diagnosis not present

## 2024-05-19 DIAGNOSIS — F4323 Adjustment disorder with mixed anxiety and depressed mood: Secondary | ICD-10-CM | POA: Diagnosis not present

## 2024-05-20 DIAGNOSIS — F332 Major depressive disorder, recurrent severe without psychotic features: Secondary | ICD-10-CM | POA: Diagnosis not present

## 2024-05-22 ENCOUNTER — Other Ambulatory Visit: Payer: Self-pay

## 2024-05-22 MED ORDER — CETIRIZINE HCL 10 MG PO TABS: 10.0000 mg | ORAL_TABLET | Freq: Every day | ORAL | 2 refills | Status: DC

## 2024-05-23 DIAGNOSIS — F332 Major depressive disorder, recurrent severe without psychotic features: Secondary | ICD-10-CM | POA: Diagnosis not present

## 2024-05-24 ENCOUNTER — Other Ambulatory Visit: Payer: Self-pay | Admitting: Obstetrics and Gynecology

## 2024-05-24 DIAGNOSIS — Z3041 Encounter for surveillance of contraceptive pills: Secondary | ICD-10-CM

## 2024-05-26 DIAGNOSIS — F332 Major depressive disorder, recurrent severe without psychotic features: Secondary | ICD-10-CM | POA: Diagnosis not present

## 2024-05-26 NOTE — Telephone Encounter (Signed)
.  Med refill request: [name]  Last AEX: 06/22/22 Last OV: 09/06/23 Next AEX: Not scheduled sent a message to the front for scheduling.  Last MMG (if hormonal med) Refill authorized: Please Advise?

## 2024-05-30 DIAGNOSIS — F332 Major depressive disorder, recurrent severe without psychotic features: Secondary | ICD-10-CM | POA: Diagnosis not present

## 2024-06-04 DIAGNOSIS — F332 Major depressive disorder, recurrent severe without psychotic features: Secondary | ICD-10-CM | POA: Diagnosis not present

## 2024-06-05 ENCOUNTER — Encounter

## 2024-06-06 DIAGNOSIS — F332 Major depressive disorder, recurrent severe without psychotic features: Secondary | ICD-10-CM | POA: Diagnosis not present

## 2024-06-10 DIAGNOSIS — F332 Major depressive disorder, recurrent severe without psychotic features: Secondary | ICD-10-CM | POA: Diagnosis not present

## 2024-06-13 DIAGNOSIS — F332 Major depressive disorder, recurrent severe without psychotic features: Secondary | ICD-10-CM | POA: Diagnosis not present

## 2024-06-17 DIAGNOSIS — F332 Major depressive disorder, recurrent severe without psychotic features: Secondary | ICD-10-CM | POA: Diagnosis not present

## 2024-06-20 DIAGNOSIS — F332 Major depressive disorder, recurrent severe without psychotic features: Secondary | ICD-10-CM | POA: Diagnosis not present

## 2024-06-24 DIAGNOSIS — F332 Major depressive disorder, recurrent severe without psychotic features: Secondary | ICD-10-CM | POA: Diagnosis not present

## 2024-06-27 DIAGNOSIS — F332 Major depressive disorder, recurrent severe without psychotic features: Secondary | ICD-10-CM | POA: Diagnosis not present

## 2024-07-01 DIAGNOSIS — F332 Major depressive disorder, recurrent severe without psychotic features: Secondary | ICD-10-CM | POA: Diagnosis not present

## 2024-07-03 DIAGNOSIS — F332 Major depressive disorder, recurrent severe without psychotic features: Secondary | ICD-10-CM | POA: Diagnosis not present

## 2024-07-04 DIAGNOSIS — E282 Polycystic ovarian syndrome: Secondary | ICD-10-CM | POA: Diagnosis not present

## 2024-07-04 DIAGNOSIS — L83 Acanthosis nigricans: Secondary | ICD-10-CM | POA: Diagnosis not present

## 2024-07-14 ENCOUNTER — Ambulatory Visit (INDEPENDENT_AMBULATORY_CARE_PROVIDER_SITE_OTHER): Admitting: Obstetrics and Gynecology

## 2024-07-14 ENCOUNTER — Encounter: Payer: Self-pay | Admitting: Obstetrics and Gynecology

## 2024-07-14 VITALS — BP 116/78 | HR 98 | Ht 63.75 in | Wt 220.0 lb

## 2024-07-14 DIAGNOSIS — Z1331 Encounter for screening for depression: Secondary | ICD-10-CM | POA: Diagnosis not present

## 2024-07-14 DIAGNOSIS — E282 Polycystic ovarian syndrome: Secondary | ICD-10-CM | POA: Diagnosis not present

## 2024-07-14 DIAGNOSIS — Z3041 Encounter for surveillance of contraceptive pills: Secondary | ICD-10-CM

## 2024-07-14 DIAGNOSIS — Z01419 Encounter for gynecological examination (general) (routine) without abnormal findings: Secondary | ICD-10-CM

## 2024-07-14 DIAGNOSIS — Z0000 Encounter for general adult medical examination without abnormal findings: Secondary | ICD-10-CM

## 2024-07-14 MED ORDER — DROSPIRENONE-ETHINYL ESTRADIOL 3-0.03 MG PO TABS: 1.0000 | ORAL_TABLET | Freq: Every day | ORAL | 3 refills | Status: AC

## 2024-07-14 NOTE — Patient Instructions (Signed)

## 2024-07-14 NOTE — Progress Notes (Signed)
 23 y.o. G0P0000 Single Hispanic female here for annual exam. Has some sort of rash under her arms. PCP does not know if this is a situation for GYN or if she needs to see a different doctor.    Her mother is present for the talking portion of the visit.   Saw her PCP regarding the rash under her arms.    No acne or increased hair growth.     Has monthly menses.    Never SA.    Having a migraine today.   Denies visual or auditory aura. Receives Botox and Ajovy through [facility].    Has gastroparesis.     Patient and mother state she is doing well with her treatment of depression.   PCP: Stephane Leita DEL, MD   Patient's last menstrual period was 07/07/2024 (approximate).     Period Cycle (Days): 28 Period Duration (Days): 7 Period Pattern: Regular Menstrual Flow: Moderate Menstrual Control: Maxi pad Dysmenorrhea: None     Sexually active: No.  The current method of family planning is OCP (estrogen/progesterone).    Menopausal hormone therapy:  n/a Exercising: No.   Smoker:  no  OB History  Gravida Para Term Preterm AB Living  0 0 0 0 0 0  SAB IAB Ectopic Multiple Live Births  0 0 0 0 0     HEALTH MAINTENANCE: Last 2 paps:  n/a History of abnormal Pap or positive HPV:  no Mammogram:   n/a Colonoscopy:  09/12/23 Bone Density:  n/a  Result  n/a   Immunization History  Administered Date(s) Administered   HPV 9-valent 02/15/2021, 06/22/2022, 09/06/2023   Influenza,inj,Quad PF,6+ Mos 06/22/2022   Meningococcal Conjugate 04/04/2022   Tdap 01/30/2022      reports that she has never smoked. She has never used smokeless tobacco. She reports that she does not drink alcohol  and does not use drugs.  Past Medical History:  Diagnosis Date   Acquired primary ovarian hypogonadism    ADD (attention deficit disorder)    Amenorrhea    when cycles first started age 59   Anxiety    Depression    Elevated prolactin level 09/16/2018   Taking Pamelor.   Gallbladder problem     Gastroparesis    Headache(784.0)    migraine without aura   IBS (irritable bowel syndrome)    PCOS (polycystic ovarian syndrome)    PCOS (polycystic ovarian syndrome)     Past Surgical History:  Procedure Laterality Date   CHOLECYSTECTOMY     CHOLECYSTECTOMY  2018    Current Outpatient Medications  Medication Sig Dispense Refill   Acetaminophen  (TYLENOL  PO) Take 1 tablet by mouth daily as needed (pain). Fast acting     botulinum toxin Type A (BOTOX) 200 units injection Inject 155 Units into the muscle.     cetirizine  (ZYRTEC  ALLERGY) 10 MG tablet Take 1 tablet (10 mg total) by mouth daily. 30 tablet 2   desvenlafaxine (PRISTIQ) 50 MG 24 hr tablet Take 50 mg by mouth daily.     drospirenone -ethinyl estradiol  (YASMIN ) 3-0.03 MG tablet Take 1 tablet by mouth daily. Needs to schedule annual exam to receive further refills. 84 tablet 0   famotidine  (PEPCID ) 20 MG tablet Take 1 tablet (20 mg total) by mouth 2 (two) times daily. 60 tablet 2   Fremanezumab-vfrm (AJOVY) 225 MG/1.5ML SOAJ Inject into the skin.     MOTEGRITY 2 MG TABS Take by mouth.     ondansetron  (ZOFRAN -ODT) 4 MG disintegrating tablet Take  by mouth.     pantoprazole (PROTONIX) 40 MG tablet Take 40 mg by mouth daily.     sodium fluoride (FLUORISHIELD) 1.1 % GEL dental gel PreviDent 5000 Dry Mouth 1.1 % dental paste     Vitamin D , Ergocalciferol , (DRISDOL ) 1.25 MG (50000 UNIT) CAPS capsule Take 1 capsule (50,000 Units total) by mouth every 7 (seven) days. 12 capsule 0   promethazine (PHENERGAN) 25 MG tablet Take 25 mg by mouth every 6 (six) hours as needed. (Patient not taking: Reported on 07/14/2024)     No current facility-administered medications for this visit.    ALLERGIES: Shrimp (diagnostic) and Latex  Family History  Adopted: Yes    Review of Systems  All other systems reviewed and are negative.   PHYSICAL EXAM:  BP 116/78 (BP Location: Left Arm, Patient Position: Sitting)   Pulse 98   Ht 5' 3.75 (1.619  m)   Wt 220 lb (99.8 kg)   LMP 07/07/2024 (Approximate)   SpO2 98%   BMI 38.06 kg/m     General appearance: alert, cooperative and appears stated age Head: normocephalic, without obvious abnormality, atraumatic Neck: no adenopathy, supple, symmetrical, trachea midline and thyroid  normal to inspection and palpation Lungs: clear to auscultation bilaterally Breasts: normal appearance, no masses or tenderness, No nipple retraction or dimpling, No nipple discharge or bleeding, No axillary adenopathy Heart: regular rate and rhythm Abdomen: soft, non-tender; no masses, no organomegaly Extremities: extremities normal, atraumatic, no cyanosis or edema Skin: skin color, texture, turgor normal. No rashes or lesions Lymph nodes: cervical, supraclavicular, and axillary nodes normal.  Velvety brown skin coloration change of bilateral axilla.  Neurologic: grossly normal  Pelvic:  Declined.  Chaperone was present for entire exam:  Geni CROME, CMA  ASSESSMENT: Well woman visit with gynecologic exam. Dysmenorrhea.   Controlled on COCs. Birth control pill surveillance. Hx PCOS. Acanthosis nigricans.   Hx elevated prolactin while on Pamelor.   Normalized off Pamelor.  Migraines.  PHQ-2-9: 9.  Under treatment.   PLAN: Self breast awareness reviewed. Pap and HRV collected:  No.  Patient declines pelvic and pap.  Guidelines for Calcium, Vitamin D , regular exercise program including cardiovascular and weight bearing exercise. Medication refills:  Yasmin  3 packs and 3 refills.  Patient instructed in proper use of pills.  Follow up with psychiatry.  I did discuss potential counseling referral from her psychiatrist also. Follow up:  yearly and prn.

## 2024-07-15 DIAGNOSIS — G43709 Chronic migraine without aura, not intractable, without status migrainosus: Secondary | ICD-10-CM | POA: Diagnosis not present

## 2024-07-16 DIAGNOSIS — F332 Major depressive disorder, recurrent severe without psychotic features: Secondary | ICD-10-CM | POA: Diagnosis not present

## 2024-07-22 DIAGNOSIS — F332 Major depressive disorder, recurrent severe without psychotic features: Secondary | ICD-10-CM | POA: Diagnosis not present

## 2024-07-28 DIAGNOSIS — F332 Major depressive disorder, recurrent severe without psychotic features: Secondary | ICD-10-CM | POA: Diagnosis not present

## 2024-07-30 DIAGNOSIS — F332 Major depressive disorder, recurrent severe without psychotic features: Secondary | ICD-10-CM | POA: Diagnosis not present

## 2024-08-04 DIAGNOSIS — F332 Major depressive disorder, recurrent severe without psychotic features: Secondary | ICD-10-CM | POA: Diagnosis not present

## 2024-08-07 DIAGNOSIS — F332 Major depressive disorder, recurrent severe without psychotic features: Secondary | ICD-10-CM | POA: Diagnosis not present

## 2024-08-11 ENCOUNTER — Encounter: Payer: Self-pay | Admitting: Allergy

## 2024-08-11 ENCOUNTER — Encounter: Payer: Self-pay | Admitting: Obstetrics and Gynecology

## 2024-08-12 DIAGNOSIS — F332 Major depressive disorder, recurrent severe without psychotic features: Secondary | ICD-10-CM | POA: Diagnosis not present

## 2024-08-15 ENCOUNTER — Ambulatory Visit

## 2024-08-15 DIAGNOSIS — F332 Major depressive disorder, recurrent severe without psychotic features: Secondary | ICD-10-CM | POA: Diagnosis not present

## 2024-08-19 DIAGNOSIS — F332 Major depressive disorder, recurrent severe without psychotic features: Secondary | ICD-10-CM | POA: Diagnosis not present

## 2024-08-19 NOTE — Telephone Encounter (Signed)
 My chart message sent

## 2024-09-02 ENCOUNTER — Other Ambulatory Visit: Payer: Self-pay

## 2024-09-02 MED ORDER — CETIRIZINE HCL 10 MG PO TABS: 10.0000 mg | ORAL_TABLET | Freq: Every day | ORAL | 2 refills | Status: AC

## 2024-12-01 ENCOUNTER — Ambulatory Visit

## 2025-07-20 ENCOUNTER — Ambulatory Visit: Admitting: Obstetrics and Gynecology
